# Patient Record
Sex: Female | Born: 1965
Health system: Southern US, Community
[De-identification: ages and names within clinical notes are randomized; demographics above are authoritative.]

## PROBLEM LIST (undated history)

## (undated) DIAGNOSIS — T7840XA Allergy, unspecified, initial encounter: Secondary | ICD-10-CM

## (undated) DIAGNOSIS — N289 Disorder of kidney and ureter, unspecified: Secondary | ICD-10-CM

## (undated) DIAGNOSIS — D649 Anemia, unspecified: Secondary | ICD-10-CM

## (undated) DIAGNOSIS — C801 Malignant (primary) neoplasm, unspecified: Secondary | ICD-10-CM

## (undated) DIAGNOSIS — I10 Essential (primary) hypertension: Secondary | ICD-10-CM

## (undated) DIAGNOSIS — K5790 Diverticulosis of intestine, part unspecified, without perforation or abscess without bleeding: Secondary | ICD-10-CM

## (undated) DIAGNOSIS — M549 Dorsalgia, unspecified: Secondary | ICD-10-CM

## (undated) DIAGNOSIS — R5383 Other fatigue: Secondary | ICD-10-CM

## (undated) DIAGNOSIS — F329 Major depressive disorder, single episode, unspecified: Secondary | ICD-10-CM

## (undated) DIAGNOSIS — E7800 Pure hypercholesterolemia, unspecified: Secondary | ICD-10-CM

## (undated) DIAGNOSIS — F32A Depression, unspecified: Secondary | ICD-10-CM

## (undated) HISTORY — PX: URETERAL REIMPLANTION: SHX2611

## (undated) HISTORY — DX: Allergy, unspecified, initial encounter: T78.40XA

## (undated) HISTORY — PX: OTHER SURGICAL HISTORY: SHX169

## (undated) HISTORY — PX: APPENDECTOMY: SHX54

## (undated) HISTORY — DX: Other fatigue: R53.83

## (undated) HISTORY — PX: ENDOMETRIAL ABLATION: SHX621

## (undated) HISTORY — DX: Diverticulosis of intestine, part unspecified, without perforation or abscess without bleeding: K57.90

## (undated) HISTORY — DX: Dorsalgia, unspecified: M54.9

## (undated) HISTORY — DX: Anemia, unspecified: D64.9

## (undated) HISTORY — DX: Malignant (primary) neoplasm, unspecified: C80.1

## (undated) HISTORY — DX: Depression, unspecified: F32.A

## (undated) HISTORY — DX: Pure hypercholesterolemia, unspecified: E78.00

---
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
## 2002-02-21 ENCOUNTER — Ambulatory Visit (HOSPITAL_COMMUNITY): Admission: RE | Admit: 2002-02-21 | Discharge: 2002-02-21 | Payer: Self-pay | Admitting: Urology

## 2002-02-21 ENCOUNTER — Encounter: Payer: Self-pay | Admitting: Urology

## 2011-04-03 DIAGNOSIS — L7211 Pilar cyst: Secondary | ICD-10-CM | POA: Insufficient documentation

## 2011-04-13 DIAGNOSIS — L723 Sebaceous cyst: Secondary | ICD-10-CM | POA: Insufficient documentation

## 2011-08-24 DIAGNOSIS — S61019A Laceration without foreign body of unspecified thumb without damage to nail, initial encounter: Secondary | ICD-10-CM | POA: Insufficient documentation

## 2012-03-22 DIAGNOSIS — M25579 Pain in unspecified ankle and joints of unspecified foot: Secondary | ICD-10-CM | POA: Insufficient documentation

## 2013-10-24 DIAGNOSIS — L249 Irritant contact dermatitis, unspecified cause: Secondary | ICD-10-CM | POA: Insufficient documentation

## 2013-10-24 DIAGNOSIS — B379 Candidiasis, unspecified: Secondary | ICD-10-CM | POA: Insufficient documentation

## 2014-04-16 DIAGNOSIS — N390 Urinary tract infection, site not specified: Secondary | ICD-10-CM | POA: Insufficient documentation

## 2014-04-20 DIAGNOSIS — Z905 Acquired absence of kidney: Secondary | ICD-10-CM | POA: Insufficient documentation

## 2014-08-16 LAB — HM COLONOSCOPY

## 2017-10-25 LAB — CBC AND DIFFERENTIAL
HCT: 41 (ref 36–46)
Hemoglobin: 13.7 (ref 12.0–16.0)
Neutrophils Absolute: 3
Platelets: 223 (ref 150–399)
WBC: 5.7

## 2017-10-25 LAB — CBC: RBC: 4.33 (ref 3.87–5.11)

## 2017-10-27 LAB — HM PAP SMEAR: HM Pap smear: NEGATIVE

## 2018-05-24 LAB — HM MAMMOGRAPHY

## 2018-07-22 MED FILL — LOSARTAN POTASSIUM 50 MG TA: 50 | 30 days supply | Qty: 30 | Fill #0

## 2018-07-22 MED FILL — LOSARTAN POTASSIUM 25 MG TA: 25 | 30 days supply | Qty: 30 | Fill #0

## 2018-08-26 MED FILL — LOSARTAN POTASSIUM 50 MG TA: 50 | 30 days supply | Qty: 30 | Fill #1

## 2018-08-26 MED FILL — LOSARTAN POTASSIUM 25 MG TA: 25 | 30 days supply | Qty: 30 | Fill #1

## 2018-10-13 MED FILL — LOSARTAN POTASSIUM 25 MG TA: 25 | 30 days supply | Qty: 30 | Fill #2

## 2018-10-13 MED FILL — LOSARTAN POTASSIUM 50 MG TA: 50 | 30 days supply | Qty: 30 | Fill #2

## 2018-11-21 MED FILL — LOSARTAN POTASSIUM 25 MG TA: 25 | 30 days supply | Qty: 30 | Fill #3

## 2018-11-21 MED FILL — LOSARTAN POTASSIUM 50 MG TA: 50 | 30 days supply | Qty: 30 | Fill #3

## 2019-01-06 MED FILL — LOSARTAN POTASSIUM 50 MG TA: 50 | 30 days supply | Qty: 30 | Fill #4

## 2019-01-06 MED FILL — LOSARTAN POTASSIUM 25 MG TA: 25 | 30 days supply | Qty: 30 | Fill #4

## 2019-02-07 ENCOUNTER — Emergency Department (INDEPENDENT_AMBULATORY_CARE_PROVIDER_SITE_OTHER)
Admission: EM | Admit: 2019-02-07 | Discharge: 2019-02-07 | Disposition: A | Discharge status: Home / Self Care | Payer: 59 | Source: Home / Self Care

## 2019-02-07 ENCOUNTER — Encounter: Payer: Self-pay | Admitting: Family Medicine

## 2019-02-07 ENCOUNTER — Other Ambulatory Visit: Payer: Self-pay

## 2019-02-07 DIAGNOSIS — K5732 Diverticulitis of large intestine without perforation or abscess without bleeding: Secondary | ICD-10-CM

## 2019-02-07 DIAGNOSIS — R300 Dysuria: Secondary | ICD-10-CM | POA: Diagnosis not present

## 2019-02-07 HISTORY — DX: Disorder of kidney and ureter, unspecified: N28.9

## 2019-02-07 HISTORY — DX: Essential (primary) hypertension: I10

## 2019-02-07 LAB — POCT URINALYSIS DIP (MANUAL ENTRY)
Bilirubin, UA: NEGATIVE
Glucose, UA: NEGATIVE mg/dL
Ketones, POC UA: NEGATIVE mg/dL
Nitrite, UA: NEGATIVE
Protein Ur, POC: NEGATIVE mg/dL
Spec Grav, UA: 1.015 (ref 1.010–1.025)
Urobilinogen, UA: 0.2 E.U./dL
pH, UA: 6 (ref 5.0–8.0)

## 2019-02-07 MED ORDER — AMOXICILLIN-POT CLAVULANATE 875-125 MG PO TABS: 1.0000 | ORAL_TABLET | Freq: Two times a day (BID) | ORAL | 0 refills | Status: DC

## 2019-02-07 MED FILL — AMOX-CLAV 875-125 MG TABLET: 875-125 | 7 days supply | Qty: 14 | Fill #0

## 2019-02-07 NOTE — ED Provider Notes (Signed)
[name]    CSN: [id number] Arrival date & time: 02/07/19  1315      History   Chief Complaint Chief Complaint  Patient presents with  . Dysuria    HPI Lisa Glass is a 53 y.o. female.   Initial visit for this 53 yo woman complaining of urinary symptoms.  Typically, she gets one UTI per year.  She has been having "bladder pressure" for about a week now.  No fever, flank pain.    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 Hypertension   . Renal disorder     There are no active problems to display for this patient.   Past Surgical History:  Procedure Laterality Date  . ENDOMETRIAL ABLATION    . kidney     removal    OB History   No obstetric history on file.      Home Medications    Prior to Admission medications   Medication Sig Start Date End Date Taking? Authorizing Provider  losartan (COZAAR) 100 MG tablet Take 75 mg by mouth daily.   Yes [provider]  amoxicillin-clavulanate (AUGMENTIN) 875-125 MG tablet Take 1 tablet by mouth every 12 (twelve) hours. 02/07/19   Robyn Haber, MD    Family History No family history on file.  Social History Social History   Tobacco Use  . Smoking status: Not on file  Substance Use Topics  . Alcohol use: Not on file  . Drug use: Not on file     Allergies   Patient has no known allergies.   Review of Systems Review of Systems  Constitutional: Negative.   HENT: Negative.   Gastrointestinal: Positive for constipation and nausea.  Genitourinary: Positive for pelvic pain. Negative for dysuria and frequency.  Neurological: Negative.   All other systems reviewed and are negative.    Physical Exam Triage Vital Signs ED Triage Vitals  Enc Vitals Group     BP      Pulse      Resp      Temp      Temp src       SpO2      Weight      Height      Head Circumference      Peak Flow      Pain Score      Pain Loc      Pain Edu?      Excl. in [place]?    No data found.  Updated Vital Signs BP (!) 161/84 (BP Location: Right Arm)   Pulse 74   Temp (!) 97.2 F (36.2 C) (Oral)   Resp 16   Ht 5\' 4"  (1.626 m)   Wt 90.7 kg   SpO2 99%   BMI 34.33 kg/m    Physical Exam Vitals signs and nursing note reviewed.  Constitutional:      Appearance: Normal appearance.  HENT:     Head: Normocephalic.  Eyes:     Conjunctiva/sclera: Conjunctivae normal.  Neck:     Musculoskeletal: Normal range of motion and neck supple.  Cardiovascular:     Rate and Rhythm: Normal rate.  Pulmonary:     Effort: Pulmonary effort is normal.     Breath sounds: Normal breath sounds.  Abdominal:     General: Abdomen is flat.  Tenderness: There is abdominal tenderness. There is no guarding or rebound.     Comments: Fullness and tenderness in LLQ  Musculoskeletal: Normal range of motion.  Skin:    General: Skin is warm and dry.  Neurological:     General: No focal deficit present.     Mental Status: She is alert and oriented to person, place, and time.  Psychiatric:        Mood and Affect: Mood normal.      UC Treatments / Results  Labs (all labs ordered are listed, but only abnormal results are displayed) Labs Reviewed  URINE CULTURE  POCT URINALYSIS DIP (MANUAL ENTRY)    EKG   Radiology No results found.  Procedures Procedures (including critical care time)  Medications Ordered in UC Medications - No data to display  Initial Impression / Assessment and Plan / UC Course  I have reviewed the triage vital signs and the nursing notes.  Pertinent labs & imaging results that were available during my care of the patient were reviewed by me and considered in my medical decision making (see chart for details).    Final Clinical Impressions(s) / UC Diagnoses   Final diagnoses:  Dysuria   Diverticulitis of colon   Discharge Instructions   None    ED Prescriptions    Medication Sig Dispense Auth. Provider   amoxicillin-clavulanate (AUGMENTIN) 875-125 MG tablet Take 1 tablet by mouth every 12 (twelve) hours. 14 tablet Robyn Haber, MD     I have reviewed the PDMP during this encounter.   Robyn Haber, MD 02/07/19 1413

## 2019-02-07 NOTE — ED Triage Notes (Signed)
Patient has history of kidney problems; gets UTIs at least once per year; about one week ago began experiencing bladder pressure with no other S&S.  She has not travelled outside [place].  She has had her influenza vacc this season.

## 2019-02-14 MED FILL — LOSARTAN POTASSIUM 25 MG TA: 25 | 30 days supply | Qty: 30 | Fill #5

## 2019-02-14 MED FILL — LOSARTAN POTASSIUM 50 MG TA: 50 | 30 days supply | Qty: 30 | Fill #5

## 2019-04-03 MED FILL — LOSARTAN POTASSIUM 25 MG TA: 25 | 30 days supply | Qty: 30 | Fill #0

## 2019-04-03 MED FILL — LOSARTAN POTASSIUM 50 MG TA: 50 | 30 days supply | Qty: 30 | Fill #0

## 2019-05-03 DIAGNOSIS — I10 Essential (primary) hypertension: Secondary | ICD-10-CM | POA: Diagnosis not present

## 2019-05-03 DIAGNOSIS — Q600 Renal agenesis, unilateral: Secondary | ICD-10-CM | POA: Diagnosis not present

## 2019-05-08 MED FILL — LOSARTAN POTASSIUM 50 MG TA: 50 | 30 days supply | Qty: 30 | Fill #1

## 2019-05-08 MED FILL — LOSARTAN POTASSIUM 25 MG TA: 25 | 30 days supply | Qty: 30 | Fill #1

## 2019-06-20 MED FILL — LOSARTAN POTASSIUM 50 MG TA: 50 | 30 days supply | Qty: 30 | Fill #2

## 2019-06-20 MED FILL — LOSARTAN POTASSIUM 25 MG TA: 25 | 30 days supply | Qty: 30 | Fill #2

## 2019-08-04 MED FILL — LOSARTAN POTASSIUM 25 MG TA: 25 | 90 days supply | Qty: 90 | Fill #0

## 2019-08-04 MED FILL — LOSARTAN POTASSIUM 50 MG TA: 50 | 90 days supply | Qty: 90 | Fill #0

## 2019-08-15 ENCOUNTER — Encounter: Payer: Self-pay | Admitting: Osteopathic Medicine

## 2019-08-15 ENCOUNTER — Other Ambulatory Visit: Payer: Self-pay

## 2019-08-15 ENCOUNTER — Ambulatory Visit (INDEPENDENT_AMBULATORY_CARE_PROVIDER_SITE_OTHER): Payer: 59 | Admitting: Osteopathic Medicine

## 2019-08-15 VITALS — BP 132/89 | HR 60 | Temp 97.7°F | Ht 63.0 in | Wt 194.0 lb

## 2019-08-15 DIAGNOSIS — Z0100 Encounter for examination of eyes and vision without abnormal findings: Secondary | ICD-10-CM

## 2019-08-15 DIAGNOSIS — Z1231 Encounter for screening mammogram for malignant neoplasm of breast: Secondary | ICD-10-CM

## 2019-08-15 DIAGNOSIS — K5792 Diverticulitis of intestine, part unspecified, without perforation or abscess without bleeding: Secondary | ICD-10-CM | POA: Diagnosis not present

## 2019-08-15 DIAGNOSIS — Z23 Encounter for immunization: Secondary | ICD-10-CM

## 2019-08-15 DIAGNOSIS — E28319 Asymptomatic premature menopause: Secondary | ICD-10-CM

## 2019-08-15 MED ORDER — METRONIDAZOLE 500 MG PO TABS: 500.0000 mg | ORAL_TABLET | Freq: Three times a day (TID) | ORAL | 0 refills | Status: AC

## 2019-08-15 MED ORDER — CIPROFLOXACIN HCL 500 MG PO TABS: 500.0000 mg | ORAL_TABLET | Freq: Two times a day (BID) | ORAL | 0 refills | Status: AC

## 2019-08-15 NOTE — Progress Notes (Signed)
Lisa Glass is a 54 y.o. female who presents to  [facility]  today, 08/15/19, seeking care for the following: . Establish care  . Abdominal pain, feel sismilar to previous diverticulitis flare   Pleasant new pt here to establish Works for [employer], is an Therapist, sports w/ PICU at [employer]   Hx HTN, depression, solitary kidney d/t s/p R nephrectomy Currently on Losartan 75 mg daily   Ongoing fatigue a few years   1 episode of palpitations at nighttime, heart pounding, resolved spontaneously, has happened similarly maybe 2-3 times total    Abdominal pain w/ hx diverticulitis 02/2019. Concerned about recurrence not, no fever. (+)LLQ colicky pain xfew days   Former smoker quit as teenager    1 functioning kidney s/p nephrectomy as child d/t reflux complications  Follows w/ nephrology last visit 05/03/19: cont meds, f/u annually  Labs at that time: Cr 0.84  No periods d/t s/p endometrial ablation  Pap 10/2017 NILM and (-)HPV  Early menopause     ASSESSMENT & PLAN with other pertinent history/findings:  The primary encounter diagnosis was Diverticulitis. Diagnoses of Need for shingles vaccine, Encounter for screening mammogram for malignant neoplasm of breast, Premature menopause, and Eye exam, routine were also pertinent to this visit.      Orders Placed This Encounter  Procedures  . DG DXA FRACTURE ASSESSMENT  . MM 3D SCREEN BREAST BILATERAL  . Varicella-zoster vaccine IM (Shingrix)  . Ambulatory referral to Optometry    Meds ordered this encounter  Medications  . ciprofloxacin (CIPRO) 500 MG tablet    Sig: Take 1 tablet (500 mg total) by mouth 2 (two) times daily for 7 days.    Dispense:  14 tablet    Refill:  0  . metroNIDAZOLE (FLAGYL) 500 MG tablet    Sig: Take 1 tablet (500 mg total) by mouth 3 (three) times daily for 7 days.    Dispense:  21 tablet    Refill:  0       Follow-up instructions: Return in about 6  months (around 02/15/2020) for ANNUAL (call week prior to visit for lab orders), SHINGLES SHOT #2 .                                         BP 132/89 (BP Location: Left Arm, Patient Position: Sitting, Cuff Size: Normal)   Pulse 60   Temp 97.7 F (36.5 C) (Oral)   Ht 5\' 3"  (1.6 m)   Wt 194 lb 0.6 oz (88 kg)   BMI 34.37 kg/m   Current Meds  Medication Sig  . losartan (COZAAR) 100 MG tablet Take 75 mg by mouth daily.    No results found for this or any previous visit (from the past 72 hour(s)).  No results found.  Depression screen PHQ 2/9 08/15/2019  Decreased Interest 1  Down, Depressed, Hopeless 1  PHQ - 2 Score 2  Altered sleeping 1  Tired, decreased energy 1  Change in appetite 0  Feeling bad or failure about yourself  0  Trouble concentrating 0  Moving slowly or fidgety/restless 0  Suicidal thoughts 0  PHQ-9 Score 4  Difficult doing work/chores Somewhat difficult    GAD 7 : Generalized Anxiety Score 08/15/2019  Nervous, Anxious, on Edge 0  Control/stop worrying 0  Worry too much - different things 0  Trouble  relaxing 0  Restless 0  Easily annoyed or irritable 0  Afraid - awful might happen 0  Total GAD 7 Score 0  Anxiety Difficulty Not difficult at all      All questions at time of visit were answered - patient instructed to contact office with any additional concerns or updates.  ER/RTC precautions were reviewed with the patient.  Please note: voice recognition software was used to produce this document, and typos may escape review. Please contact Dr. Sheppard Coil for any needed clarifications.

## 2019-08-15 NOTE — Progress Notes (Signed)
Negative for intraepithelial lesion or malignancy.  

## 2019-08-20 ENCOUNTER — Encounter: Payer: Self-pay | Admitting: Osteopathic Medicine

## 2019-08-21 ENCOUNTER — Encounter: Payer: Self-pay | Admitting: Osteopathic Medicine

## 2019-08-23 ENCOUNTER — Other Ambulatory Visit: Payer: Self-pay

## 2019-08-23 ENCOUNTER — Ambulatory Visit (INDEPENDENT_AMBULATORY_CARE_PROVIDER_SITE_OTHER): Payer: 59

## 2019-08-23 DIAGNOSIS — E28319 Asymptomatic premature menopause: Secondary | ICD-10-CM | POA: Diagnosis not present

## 2019-08-23 DIAGNOSIS — Z780 Asymptomatic menopausal state: Secondary | ICD-10-CM | POA: Diagnosis not present

## 2019-08-23 DIAGNOSIS — Z1231 Encounter for screening mammogram for malignant neoplasm of breast: Secondary | ICD-10-CM

## 2019-08-23 DIAGNOSIS — Z13820 Encounter for screening for osteoporosis: Secondary | ICD-10-CM | POA: Diagnosis not present

## 2019-08-24 MED ORDER — AMOXICILLIN-POT CLAVULANATE 875-125 MG PO TABS
1.0000 | ORAL_TABLET | Freq: Two times a day (BID) | ORAL | 0 refills | Status: DC
Start: 2019-08-24 — End: 2020-03-07

## 2019-08-25 MED FILL — AMOX-CLAV 875-125 MG TABLET: 875-125 | 5 days supply | Qty: 10 | Fill #0

## 2019-11-13 ENCOUNTER — Other Ambulatory Visit (HOSPITAL_COMMUNITY): Payer: Self-pay | Admitting: Internal Medicine

## 2019-11-13 MED FILL — LOSARTAN POTASSIUM 25 MG TA: 25 | 90 days supply | Qty: 90 | Fill #0

## 2019-11-17 ENCOUNTER — Other Ambulatory Visit (HOSPITAL_COMMUNITY): Payer: Self-pay | Admitting: Internal Medicine

## 2019-11-17 MED FILL — LOSARTAN POTASSIUM 50 MG TA: 50 | 90 days supply | Qty: 90 | Fill #0

## 2020-01-15 ENCOUNTER — Encounter: Payer: 59 | Admitting: Osteopathic Medicine

## 2020-01-29 ENCOUNTER — Encounter: Payer: 59 | Admitting: Osteopathic Medicine

## 2020-02-26 MED FILL — LOSARTAN POTASSIUM 25 MG TA: 25 | 90 days supply | Qty: 90 | Fill #1

## 2020-02-26 MED FILL — LOSARTAN POTASSIUM 50 MG TA: 50 | 90 days supply | Qty: 90 | Fill #1

## 2020-03-07 ENCOUNTER — Other Ambulatory Visit (HOSPITAL_COMMUNITY): Payer: Self-pay | Admitting: Osteopathic Medicine

## 2020-03-07 ENCOUNTER — Ambulatory Visit (INDEPENDENT_AMBULATORY_CARE_PROVIDER_SITE_OTHER): Payer: 59 | Admitting: Osteopathic Medicine

## 2020-03-07 ENCOUNTER — Encounter: Payer: Self-pay | Admitting: Osteopathic Medicine

## 2020-03-07 VITALS — BP 144/90 | HR 69 | Temp 97.7°F | Wt 198.2 lb

## 2020-03-07 DIAGNOSIS — Z905 Acquired absence of kidney: Secondary | ICD-10-CM

## 2020-03-07 DIAGNOSIS — F339 Major depressive disorder, recurrent, unspecified: Secondary | ICD-10-CM | POA: Diagnosis not present

## 2020-03-07 DIAGNOSIS — Z0000 Encounter for general adult medical examination without abnormal findings: Secondary | ICD-10-CM | POA: Diagnosis not present

## 2020-03-07 DIAGNOSIS — B001 Herpesviral vesicular dermatitis: Secondary | ICD-10-CM

## 2020-03-07 DIAGNOSIS — I10 Essential (primary) hypertension: Secondary | ICD-10-CM | POA: Diagnosis not present

## 2020-03-07 MED ORDER — VALACYCLOVIR HCL 1 G PO TABS: 2000.0000 mg | ORAL_TABLET | Freq: Two times a day (BID) | ORAL | 0 refills | Status: AC

## 2020-03-07 MED ORDER — BUPROPION HCL ER (XL) 150 MG PO TB24
150.0000 mg | ORAL_TABLET | ORAL | 1 refills | Status: DC
Start: 2020-03-07 — End: 2020-05-23

## 2020-03-07 MED FILL — valACYclovir HCL 1 GM TABS: 1 | 3 days supply | Qty: 12 | Fill #0

## 2020-03-07 MED FILL — buPROPion HCL ER (XL) 150 M: 150 | 90 days supply | Qty: 90 | Fill #0

## 2020-03-07 NOTE — Progress Notes (Signed)
HPI: Lisa Glass is a 54 y.o. female who  has a past medical history of Depression, Hypertension, and Renal disorder.  she presents to [facility] today, 03/07/20,  for chief complaint of: Annual physical   Mouth ulcerations/cracking and flaking of lips . Context: reports hx of cold sores starting in mid 93s . Location: around lips and in corners of mouth . Quality: dry, flaking skin, with occasional vesicles . Duration: 2 years consistent; about 6 years intermittent . Modifying factors: o Hydrocortisone cream and lotrimin did not help o Unsure if worsened by stress given significant stress in general over last few years . Assoc signs/symptoms: burning sensation, itching   Mental health Pt reports depressed mood for the past year with worsening over the past few months after her father was admitted to the hospital where he lives in [place]. She reports sleeping 16+ hours a day on her days off, anhedonia, and 30 lb weight gain over last year.  Pt has history of depression previously treated with Wellbutrin 300 mg with good response most recently about 4 years ago.   Hypertension Blood pressure somewhat elevated and patient reports increase of 30 lb weight gain over last year and decreased physical activity which may be contributing.    Past medical, surgical, social and family history reviewed:  Patient Active Problem List   Diagnosis Date Noted  . Premature menopause 08/15/2019  . Eye exam, routine 08/15/2019  . Diverticulitis 08/15/2019    Past Surgical History:  Procedure Laterality Date  . ENDOMETRIAL ABLATION    . kidney     removal  . URETERAL REIMPLANTION Bilateral     Social History   Tobacco Use  . Smoking status: Former Smoker    Types: Cigarettes  . Smokeless tobacco: Never Used  Substance Use Topics  . Alcohol use: Yes    Alcohol/week: 7.0 - 8.0 standard drinks    Types: 7 - 8 Glasses of wine per week    Family  History  Problem Relation Age of Onset  . High blood pressure Mother   . High blood pressure Father   . High blood pressure Sister        Twin  . Ovarian cancer Maternal Aunt   . Heart attack Maternal Uncle      Current medication list and allergy/intolerance information reviewed:    Current Outpatient Medications  Medication Sig Dispense Refill  . losartan (COZAAR) 100 MG tablet Take 75 mg by mouth daily.    Marland Kitchen buPROPion (WELLBUTRIN XL) 150 MG 24 hr tablet Take 1 tablet (150 mg total) by mouth every morning. 90 tablet 1  . valACYclovir (VALTREX) 1000 MG tablet Take 2 tablets (2,000 mg total) by mouth 2 (two) times daily for 1 day. For one day as needed for cold sore 12 tablet 0   No current facility-administered medications for this visit.    No Known Allergies    Review of Systems:  Constitutional:  No  fever, no chills, No unintentional weight changes.   HEENT: No  headache  Gastrointestinal:  + abdominal discomfort in LLQ, No  nausea, No  vomiting,  No  blood in stool, No  diarrhea, No  constipation   Skin: +  Rash around lips and in corners of mouth, No other wounds/concerning lesions  Endocrine: No cold intolerance,  No heat intolerance.  Psychiatric: +  concerns with depression, No  concerns with anxiety, + sleep problems  Exam:  BP (!) 144/90 (BP  Location: Left Arm)   Pulse 69   Temp 97.7 F (36.5 C) (Oral)   Wt 198 lb 3.2 oz (89.9 kg)   BMI 35.11 kg/m   Constitutional: VS see above. General Appearance: alert, well-developed, well-nourished, NAD  Ears, Nose, Mouth, Throat: MMM, Normal external inspection mouth/lips/gums.   Neck: No masses, trachea midline. No thyroid enlargement. No tenderness/mass appreciated. No lymphadenopathy  Respiratory: Normal respiratory effort. no wheeze, no rhonchi, no rales  Cardiovascular: S1/S2 normal, no murmur, no rub/gallop auscultated. RRR. No lower extremity edema.   Gastrointestinal: + tenderness to deep palpation in  LLQ, no masses. No hepatomegaly, no splenomegaly. No hernia appreciated.Rectal exam deferred.   Musculoskeletal: Gait normal.   Neurological: Normal balance/coordination. No tremor.   Skin: warm, dry. Partially healed ulceration present on L side of upper lip. Dry peeling skin and erythema present on skin surrounding lips. No vesicles at this time.   Psychiatric: Normal judgment/insight. Normal mood and affect.    No results found for this or any previous visit (from the past 72 hour(s)).  No results found.     ASSESSMENT/PLAN: The primary encounter diagnosis was Annual physical exam. Diagnoses of Depression, recurrent ([place]), Herpes labialis, Essential hypertension, and Single kidney were also pertinent to this visit.   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  Round of valtrex   Pt photos c/w angular cheilitis but some mild blistering appearance, very dry cracked flaking character to the vermilion borders   Will consider higher potency topical steroid if not responsive to valtrex, +/- derm referral    Hypertension   Will recheck labs listed below  Will start additional bp medication pending labs - likely beta blocker given single kidney status   counseled on diet and lifestyle   Return for recheck in one month    Single kidney   UA  Microalbumin/creatinine urine ratio   Health maintenance   CBC, CMP, lipid panel   Orders Placed This Encounter  Procedures  . CBC  . COMPLETE METABOLIC PANEL WITH GFR  . Lipid panel  . Urinalysis, Routine w reflex microscopic  . Microalbumin / creatinine urine ratio    Meds ordered this encounter  Medications  . buPROPion (WELLBUTRIN XL) 150 MG 24 hr tablet    Sig: Take 1 tablet (150 mg total) by mouth every morning.    Dispense:  90 tablet    Refill:  1  . valACYclovir (VALTREX) 1000 MG tablet    Sig: Take 2 tablets (2,000 mg total) by mouth 2 (two)  times daily for 1 day. For one day as needed for cold sore    Dispense:  12 tablet    Refill:  0    Patient Instructions  Mental health: restart Wellbutrin at 150 mg, option to increase if this is not helping within a month or so.  Mouth ulcerations: possibly oral HSV, will send Rx for antivirals and if this is helping then we can either use this as-needed or can use continuously for prevention (assuming kidney function is ok) Blood pressure: not terrible but not great! Will recheck labs right now, and will probably start additional BP medication. Work on weight loss. Maintain low salt diet.   General Preventive Care  Most recent routine screening labs: ordered today   Blood pressure goal 140/90 or less.   Tobacco: don't!   Alcohol: responsible moderation is ok for most adults - if you have concerns about  your alcohol intake, please talk to me!   Exercise: as tolerated to reduce risk of cardiovascular disease and diabetes. Strength training will also prevent osteoporosis.   Mental health: if need for mental health care (medicines, counseling, other), or concerns about moods, please let me know!   Sexual / Reproductive health: if need for STD testing, or if concerns with libido/pain problems, please let me know!  Advanced Directive: Living Will and/or Healthcare Power of Attorney recommended for all adults, regardless of age or health.  Vaccines  Flu vaccine: for almost everyone, every fall.   Shingles vaccine: done!  Tetanus booster: due 2022  COVID vaccine: THANKS for getting your vaccine! :)  Cancer screenings   Colon cancer screening: due 2026  Breast cancer screening: due 2022  Cervical cancer screening: due 2024 Infection screenings  . HIV: recommended screening at least once age 72-65, more often as needed. . Gonorrhea/Chlamydia: screening as needed . Hepatitis C: recommended once for everyone age 38-75 Other . Bone Density Test: done 08/2019 - repeat in 2 years         Visit summary with medication list and pertinent instructions was printed for patient to review. All questions at time of visit were answered - patient instructed to contact office with any additional concerns or updates. ER/RTC precautions were reviewed with the patient.     Please note: voice recognition software was used to produce this document, and typos may escape review. Please contact Dr. Sheppard Coil for any needed clarifications.     Follow-up plan: Return in about 4 weeks (around 04/04/2020) for IN-OFFICE VISIT MONITOR BLOOD PRESSURE, MENTAL HEALTH. Marland Kitchen

## 2020-03-07 NOTE — Patient Instructions (Addendum)
Mental health: restart Wellbutrin at 150 mg, option to increase if this is not helping within a month or so.  Mouth ulcerations: possibly oral HSV, will send Rx for antivirals and if this is helping then we can either use this as-needed or can use continuously for prevention (assuming kidney function is ok) Blood pressure: not terrible but not great! Will recheck labs right now, and will probably start additional BP medication. Work on weight loss. Maintain low salt diet.   General Preventive Care  Most recent routine screening labs: ordered today   Blood pressure goal 140/90 or less.   Tobacco: don't!   Alcohol: responsible moderation is ok for most adults - if you have concerns about your alcohol intake, please talk to me!   Exercise: as tolerated to reduce risk of cardiovascular disease and diabetes. Strength training will also prevent osteoporosis.   Mental health: if need for mental health care (medicines, counseling, other), or concerns about moods, please let me know!   Sexual / Reproductive health: if need for STD testing, or if concerns with libido/pain problems, please let me know!  Advanced Directive: Living Will and/or Healthcare Power of Attorney recommended for all adults, regardless of age or health.  Vaccines  Flu vaccine: for almost everyone, every fall.   Shingles vaccine: done!  Tetanus booster: due 2022  COVID vaccine: THANKS for getting your vaccine! :)  Cancer screenings   Colon cancer screening: due 2026  Breast cancer screening: due 2022  Cervical cancer screening: due 2024 Infection screenings  . HIV: recommended screening at least once age 46-65, more often as needed. . Gonorrhea/Chlamydia: screening as needed . Hepatitis C: recommended once for everyone age 82-75 Other . Bone Density Test: done 08/2019 - repeat in 2 years

## 2020-03-08 LAB — URINALYSIS, ROUTINE W REFLEX MICROSCOPIC
Bacteria, UA: NONE SEEN /HPF
Bilirubin Urine: NEGATIVE
Glucose, UA: NEGATIVE
Hyaline Cast: NONE SEEN /LPF
Ketones, ur: NEGATIVE
Leukocytes,Ua: NEGATIVE
Nitrite: NEGATIVE
Protein, ur: NEGATIVE
RBC / HPF: NONE SEEN /HPF (ref 0–2)
Specific Gravity, Urine: 1.007 (ref 1.001–1.03)
Squamous Epithelial / HPF: NONE SEEN /HPF (ref <=5)
WBC, UA: NONE SEEN /HPF (ref 0–5)
pH: 6 (ref 5.0–8.0)

## 2020-03-08 LAB — COMPLETE METABOLIC PANEL WITH GFR
AG Ratio: 1.7 (calc) (ref 1.0–2.5)
ALT: 12 U/L (ref 6–29)
AST: 23 U/L (ref 10–35)
Albumin: 4.8 g/dL (ref 3.6–5.1)
Alkaline phosphatase (APISO): 84 U/L (ref 37–153)
BUN: 17 mg/dL (ref 7–25)
CO2: 24 mmol/L (ref 20–32)
Calcium: 9.6 mg/dL (ref 8.6–10.4)
Chloride: 103 mmol/L (ref 98–110)
Creat: 0.93 mg/dL (ref 0.50–1.05)
GFR, Est African American: 82 mL/min/{1.73_m2} (ref >=60)
GFR, Est Non African American: 71 mL/min/{1.73_m2} (ref >=60)
Globulin: 2.8 g/dL (calc) (ref 1.9–3.7)
Glucose, Bld: 86 mg/dL (ref 65–99)
Potassium: 3.9 mmol/L (ref 3.5–5.3)
Sodium: 139 mmol/L (ref 135–146)
Total Bilirubin: 0.4 mg/dL (ref 0.2–1.2)
Total Protein: 7.6 g/dL (ref 6.1–8.1)

## 2020-03-08 LAB — LIPID PANEL
Cholesterol: 238 mg/dL — ABNORMAL HIGH (ref <200)
HDL: 75 mg/dL (ref >=50)
LDL Cholesterol (Calc): 141 mg/dL (calc) — ABNORMAL HIGH
Non-HDL Cholesterol (Calc): 163 mg/dL (calc) — ABNORMAL HIGH (ref <130)
Total CHOL/HDL Ratio: 3.2 (calc) (ref <5.0)
Triglycerides: 108 mg/dL (ref <150)

## 2020-03-08 LAB — CBC
HCT: 41.7 % (ref 35.0–45.0)
Hemoglobin: 14 g/dL (ref 11.7–15.5)
MCH: 31.5 pg (ref 27.0–33.0)
MCHC: 33.6 g/dL (ref 32.0–36.0)
MCV: 93.7 fL (ref 80.0–100.0)
MPV: 12.1 fL (ref 7.5–12.5)
Platelets: 229 10*3/uL (ref 140–400)
RBC: 4.45 10*6/uL (ref 3.80–5.10)
RDW: 13.1 % (ref 11.0–15.0)
WBC: 7.1 10*3/uL (ref 3.8–10.8)

## 2020-03-08 LAB — MICROALBUMIN / CREATININE URINE RATIO
Creatinine, Urine: 34 mg/dL (ref 20–275)
Microalb Creat Ratio: 126 mcg/mg creat — ABNORMAL HIGH (ref <30)
Microalb, Ur: 4.3 mg/dL

## 2020-03-09 ENCOUNTER — Encounter: Payer: Self-pay | Admitting: Osteopathic Medicine

## 2020-03-21 ENCOUNTER — Other Ambulatory Visit (HOSPITAL_COMMUNITY): Payer: Self-pay | Admitting: Osteopathic Medicine

## 2020-03-21 ENCOUNTER — Ambulatory Visit (INDEPENDENT_AMBULATORY_CARE_PROVIDER_SITE_OTHER): Payer: 59 | Admitting: Osteopathic Medicine

## 2020-03-21 ENCOUNTER — Encounter: Payer: Self-pay | Admitting: Osteopathic Medicine

## 2020-03-21 ENCOUNTER — Other Ambulatory Visit: Payer: Self-pay

## 2020-03-21 VITALS — BP 116/87 | HR 69 | Temp 97.9°F | Wt 193.1 lb

## 2020-03-21 DIAGNOSIS — R109 Unspecified abdominal pain: Secondary | ICD-10-CM | POA: Diagnosis not present

## 2020-03-21 DIAGNOSIS — R1032 Left lower quadrant pain: Secondary | ICD-10-CM

## 2020-03-21 DIAGNOSIS — R809 Proteinuria, unspecified: Secondary | ICD-10-CM

## 2020-03-21 DIAGNOSIS — Z8719 Personal history of other diseases of the digestive system: Secondary | ICD-10-CM

## 2020-03-21 DIAGNOSIS — R112 Nausea with vomiting, unspecified: Secondary | ICD-10-CM

## 2020-03-21 DIAGNOSIS — E28319 Asymptomatic premature menopause: Secondary | ICD-10-CM | POA: Diagnosis not present

## 2020-03-21 DIAGNOSIS — L989 Disorder of the skin and subcutaneous tissue, unspecified: Secondary | ICD-10-CM | POA: Diagnosis not present

## 2020-03-21 DIAGNOSIS — Z8742 Personal history of other diseases of the female genital tract: Secondary | ICD-10-CM

## 2020-03-21 MED ORDER — TRIAMCINOLONE ACETONIDE 0.5 % EX CREA: 1.0000 "application " | TOPICAL_CREAM | Freq: Two times a day (BID) | CUTANEOUS | 1 refills | Status: DC

## 2020-03-21 MED FILL — TRIAMCINOLONE ACETONIDE 0.5: 0.5 | 15 days supply | Qty: 30 | Fill #0

## 2020-03-21 NOTE — Patient Instructions (Addendum)
Plan:  CT scan of abdomen Will recheck urine for blood and protein Will recheck labs for kidney function given protein in urine  Await CT results If worse, especially if fever - to hospital!   Referral to dermatology for lip issue  I sent steroid cream to use in the meantime

## 2020-03-21 NOTE — Progress Notes (Signed)
HPI: Lisa Glass is a 54 y.o. female who  has a past medical history of Depression, Hypertension, and Renal disorder.  she presents to [facility] today, 03/21/20,  for chief complaint of:  L flank pain . Context: pt has hx single L kidney  . Location: L flank pain radiating from LLQ initially . Quality: dull ache  . Severity: had to leave work due to pain yesterday, but has improved since and is now tolerable . Duration: 1 day  . Modifying factors: none . Assoc signs/symptoms: nausea x1 day, constipation x2 days (not relieved by miralax)  o No issues voiding, frequency, urgency, fever, diarrhea, early satiety, vomiting, hematuria, no vaginal bleeding    LLQ pain . Context: history of ovarian cysts; post menopausal; post endometrial ablation; no fmhx of ovarian cancer; hx diverticulitis . Location: LLQ  . Quality: colicky . Severity: moderate . Duration: over a year, worse past few days  . Modifying factors: none . Assoc signs/symptoms: as above     Past medical, surgical, social and family history reviewed:  Patient Active Problem List   Diagnosis Date Noted  . Premature menopause 08/15/2019  . Eye exam, routine 08/15/2019  . Diverticulitis 08/15/2019    Past Surgical History:  Procedure Laterality Date  . ENDOMETRIAL ABLATION    . kidney     removal  . URETERAL REIMPLANTION Bilateral     Social History   Tobacco Use  . Smoking status: Former Smoker    Types: Cigarettes  . Smokeless tobacco: Never Used  Substance Use Topics  . Alcohol use: Yes    Alcohol/week: 7.0 - 8.0 standard drinks    Types: 7 - 8 Glasses of wine per week    Family History  Problem Relation Age of Onset  . High blood pressure Mother   . High blood pressure Father   . High blood pressure Sister        Twin  . Ovarian cancer Maternal Aunt   . Heart attack Maternal Uncle      Current medication list and allergy/intolerance information reviewed:     Current Outpatient Medications  Medication Sig Dispense Refill  . buPROPion (WELLBUTRIN XL) 150 MG 24 hr tablet Take 1 tablet (150 mg total) by mouth every morning. 90 tablet 1  . losartan (COZAAR) 100 MG tablet Take 75 mg by mouth daily.    Marland Kitchen triamcinolone cream (KENALOG) 0.5 % Apply 1 application topically 2 (two) times daily. To affected areas. 30 g 1   No current facility-administered medications for this visit.    No Known Allergies    Review of Systems:  Constitutional:  No  fever, no chills, No recent illness, No unintentional weight changes.   Gastrointestinal: + abdominal pain, + nausea, No  vomiting,  No  blood in stool, No  diarrhea, +  Constipation, no early satiety   Skin: +  Rash around lips (dry and flaking unchanged from prior visit)   Genitourinary:  No  abnormal genital bleeding, No urgency, No dysuria, No frequency, No urgency, No difficulty voiding  Exam:  BP 116/87 (BP Location: Left Arm, Patient Position: Sitting, Cuff Size: Large)   Pulse 69   Temp 97.9 F (36.6 C) (Oral)   Wt 193 lb 1.9 oz (87.6 kg)   BMI 34.21 kg/m   Constitutional: VS see above. General Appearance: alert, well-developed, well-nourished, NAD  Cardiovascular: S1/S2 normal, no murmur, no rub/gallop auscultated. RRR.  Gastrointestinal: + moderate tenderness to palpation in  LLQ and mild tenderness with ballot of L kidney, no masses. No hepatomegaly, no splenomegaly. No hernia appreciated. Bowel sounds normal. Rectal exam deferred.   Neurological: Normal balance/coordination.   Skin: warm, dry, intact. dry flaking erythematous skin around lips with no lesions noted  Psychiatric: Normal judgment/insight. Normal mood and affect.     No results found for this or any previous visit (from the past 72 hour(s)).  No results found.   ASSESSMENT/PLAN: The primary encounter diagnosis was Left lower quadrant pain. Diagnoses of Left flank pain, History of ovarian cyst, History of  diverticulitis, Proteinuria, unspecified type, Skin abnormality, Premature menopause, Left lower quadrant abdominal pain, and Non-intractable vomiting with nausea, unspecified vomiting type were also pertinent to this visit.   L flank and LLQ pain - likely nephrolithiasis, but given vague symptoms not classic for nephrolithiasis want to rule out ovarian v GI origin though less likely given postmenopausal and minimal GI symptoms.   CT abdomen and pelvis w/ and w/o contrast    ED precautions given   Follow up pending results or earlier if pain worsens or changes   Proteinuria  Rechecking labs UA, Microalbumin/creatinine ratio, CBC w/ diff, CMP, TSH   Has routine f/u w/ nephrology next month    Rash - did not respond to valtrex or lotrimin   Derm referral   Rx for triamcinolone to use in the mean time   Orders Placed This Encounter  Procedures  . CT ABDOMEN PELVIS W WO CONTRAST  . Urine Microscopic  . Microalbumin / creatinine urine ratio  . Urinalysis  . CBC with Differential/Platelet  . COMPLETE METABOLIC PANEL WITH GFR  . TSH  . Ambulatory referral to Dermatology    Meds ordered this encounter  Medications  . triamcinolone cream (KENALOG) 0.5 %    Sig: Apply 1 application topically 2 (two) times daily. To affected areas.    Dispense:  30 g    Refill:  1    Patient Instructions  Plan:  CT scan of abdomen Will recheck urine for blood and protein Will recheck labs for kidney function given protein in urine  Await CT results If worse, especially if fever - to hospital!   Referral to dermatology for lip issue  I sent steroid cream to use in the meantime          Visit summary with medication list and pertinent instructions was printed for patient to review. All questions at time of visit were answered - patient instructed to contact office with any additional concerns or updates. ER/RTC precautions were reviewed with the patient.    Please note: voice  recognition software was used to produce this document, and typos may escape review. Please contact Dr. Sheppard Coil for any needed clarifications.     Follow-up plan: Return for RECHECK PENDING RESULTS / IF WORSE OR CHANGE.

## 2020-03-22 LAB — CBC WITH DIFFERENTIAL/PLATELET
Absolute Monocytes: 369 cells/uL (ref 200–950)
Basophils Absolute: 28 cells/uL (ref 0–200)
Basophils Relative: 0.5 %
Eosinophils Absolute: 77 cells/uL (ref 15–500)
Eosinophils Relative: 1.4 %
HCT: 40.5 % (ref 35.0–45.0)
Hemoglobin: 14 g/dL (ref 11.7–15.5)
Lymphs Abs: 1958 cells/uL (ref 850–3900)
MCH: 32.6 pg (ref 27.0–33.0)
MCHC: 34.6 g/dL (ref 32.0–36.0)
MCV: 94.4 fL (ref 80.0–100.0)
MPV: 12.5 fL (ref 7.5–12.5)
Monocytes Relative: 6.7 %
Neutro Abs: 3069 cells/uL (ref 1500–7800)
Neutrophils Relative %: 55.8 %
Platelets: 221 10*3/uL (ref 140–400)
RBC: 4.29 10*6/uL (ref 3.80–5.10)
RDW: 12.9 % (ref 11.0–15.0)
Total Lymphocyte: 35.6 %
WBC: 5.5 10*3/uL (ref 3.8–10.8)

## 2020-03-22 LAB — MICROALBUMIN / CREATININE URINE RATIO
Creatinine, Urine: 90 mg/dL (ref 20–275)
Microalb Creat Ratio: 69 mcg/mg creat — ABNORMAL HIGH (ref <30)
Microalb, Ur: 6.2 mg/dL

## 2020-03-22 LAB — COMPLETE METABOLIC PANEL WITH GFR
AG Ratio: 1.9 (calc) (ref 1.0–2.5)
ALT: 12 U/L (ref 6–29)
AST: 22 U/L (ref 10–35)
Albumin: 4.4 g/dL (ref 3.6–5.1)
Alkaline phosphatase (APISO): 73 U/L (ref 37–153)
BUN: 19 mg/dL (ref 7–25)
CO2: 27 mmol/L (ref 20–32)
Calcium: 9.5 mg/dL (ref 8.6–10.4)
Chloride: 103 mmol/L (ref 98–110)
Creat: 0.91 mg/dL (ref 0.50–1.05)
GFR, Est African American: 84 mL/min/{1.73_m2} (ref >=60)
GFR, Est Non African American: 73 mL/min/{1.73_m2} (ref >=60)
Globulin: 2.3 g/dL (calc) (ref 1.9–3.7)
Glucose, Bld: 97 mg/dL (ref 65–99)
Potassium: 4.4 mmol/L (ref 3.5–5.3)
Sodium: 137 mmol/L (ref 135–146)
Total Bilirubin: 0.7 mg/dL (ref 0.2–1.2)
Total Protein: 6.7 g/dL (ref 6.1–8.1)

## 2020-03-22 LAB — URINALYSIS
Bilirubin Urine: NEGATIVE
Glucose, UA: NEGATIVE
Hgb urine dipstick: NEGATIVE
Ketones, ur: NEGATIVE
Leukocytes,Ua: NEGATIVE
Nitrite: NEGATIVE
Protein, ur: NEGATIVE
Specific Gravity, Urine: 1.019 (ref 1.001–1.03)
pH: 7 (ref 5.0–8.0)

## 2020-03-22 LAB — URINALYSIS, MICROSCOPIC ONLY
Bacteria, UA: NONE SEEN /HPF
Hyaline Cast: NONE SEEN /LPF
Squamous Epithelial / HPF: NONE SEEN /HPF (ref <=5)

## 2020-03-22 LAB — TSH: TSH: 1.06 mIU/L

## 2020-03-22 NOTE — Progress Notes (Signed)
MyChart message sent:  Labs all good except still protein in urine though not as high as few weeks ago. No blood in urine. Awaiting CT results!

## 2020-03-27 ENCOUNTER — Other Ambulatory Visit: Payer: Self-pay

## 2020-03-27 ENCOUNTER — Ambulatory Visit (INDEPENDENT_AMBULATORY_CARE_PROVIDER_SITE_OTHER): Payer: 59

## 2020-03-27 ENCOUNTER — Other Ambulatory Visit: Payer: 59

## 2020-03-27 ENCOUNTER — Encounter: Payer: Self-pay | Admitting: Osteopathic Medicine

## 2020-03-27 DIAGNOSIS — K429 Umbilical hernia without obstruction or gangrene: Secondary | ICD-10-CM | POA: Diagnosis not present

## 2020-03-27 DIAGNOSIS — R319 Hematuria, unspecified: Secondary | ICD-10-CM

## 2020-03-27 DIAGNOSIS — Z905 Acquired absence of kidney: Secondary | ICD-10-CM | POA: Diagnosis not present

## 2020-03-27 DIAGNOSIS — R1032 Left lower quadrant pain: Secondary | ICD-10-CM

## 2020-03-27 DIAGNOSIS — Z1211 Encounter for screening for malignant neoplasm of colon: Secondary | ICD-10-CM

## 2020-03-27 DIAGNOSIS — Z8719 Personal history of other diseases of the digestive system: Secondary | ICD-10-CM

## 2020-03-27 MED ORDER — IOHEXOL 300 MG/ML  SOLN
100.0000 mL | Freq: Once | INTRAMUSCULAR | Status: AC | PRN
  Administered 2020-03-27: 14:00:00 100 mL via INTRAVENOUS

## 2020-03-28 ENCOUNTER — Ambulatory Visit (INDEPENDENT_AMBULATORY_CARE_PROVIDER_SITE_OTHER): Payer: 59 | Admitting: Osteopathic Medicine

## 2020-03-28 ENCOUNTER — Encounter: Payer: Self-pay | Admitting: Osteopathic Medicine

## 2020-03-28 ENCOUNTER — Other Ambulatory Visit (HOSPITAL_BASED_OUTPATIENT_CLINIC_OR_DEPARTMENT_OTHER): Payer: Self-pay | Admitting: Osteopathic Medicine

## 2020-03-28 VITALS — BP 161/102 | HR 90 | Wt 191.0 lb

## 2020-03-28 DIAGNOSIS — R1903 Right lower quadrant abdominal swelling, mass and lump: Secondary | ICD-10-CM

## 2020-03-28 MED ORDER — ONDANSETRON 8 MG PO TBDP: 8.0000 mg | ORAL_TABLET | Freq: Three times a day (TID) | ORAL | 1 refills | Status: DC | PRN

## 2020-03-28 MED ORDER — DIAZEPAM 5 MG PO TABS: 2.5000 mg | ORAL_TABLET | Freq: Three times a day (TID) | ORAL | 0 refills | Status: DC | PRN

## 2020-03-28 MED ORDER — TRAMADOL HCL 50 MG PO TABS: 50.0000 mg | ORAL_TABLET | Freq: Three times a day (TID) | ORAL | 0 refills | Status: DC | PRN

## 2020-03-28 MED FILL — traMADol HCL 50 MG TABS: 50 | 4 days supply | Qty: 20 | Fill #0

## 2020-03-28 MED FILL — ONDANSETRON ODT 8 MG TABLET: 8 | 6 days supply | Qty: 20 | Fill #0

## 2020-03-28 MED FILL — diazePAM 5 MG TABS: 5 | 10 days supply | Qty: 30 | Fill #0

## 2020-03-28 NOTE — Patient Instructions (Addendum)
Refer gen surg [facility] Surgery   Pain Rx OK to take Miralax  OK to take Valium - try 1/2 tablet at first Also sent nausea Rx   Disc of images from [facility]: please call to request  [phone] Ask them to mail you a disc with images from CT scans: 01/152016 04/30/2014 12/29/2015

## 2020-03-28 NOTE — Progress Notes (Signed)
Lisa Glass is a 54 y.o. female who presents to  [place] at [facility]  today, 03/28/20, seeking care for the following:  . Continued lower abdominal pain, significant anxiety around CT findings. She is still having intermittent cramping pain in RLQ and occasional in LLQ. Alternative diarrhea and constipation. On pelvic exam, no adnexal or uterine mass noted. Abdominal exam, BS WNL, no rebound/guarding, some tenderness lower RLQ      ASSESSMENT & PLAN with other pertinent findings:  The encounter diagnosis was Right lower quadrant abdominal mass.   Reviewed CT images w/ patient compared to reports from previous imaging, the mass sounds like is in the same place and is slightly larger than in 2017. I doubt malignancy would grow this slowly and still be isolated to one lesion but certainly enlargement may result in being able to cause discomfort/pain. Removal or at the very least a biopsy is needed for definitive diagnosis - referral to surgery   Patient Instructions  Refer gen surg [facility] Surgery   Pain Rx OK to take Miralax  OK to take Valium - try 1/2 tablet at first Also sent nausea Rx   Disc of images from [facility]: please call to request  [phone] Ask them to mail you a disc with images from CT scans: 01/152016 04/30/2014 12/29/2015   Orders Placed This Encounter  Procedures  . Ambulatory referral to General Surgery    Meds ordered this encounter  Medications  . traMADol (ULTRAM) 50 MG tablet    Sig: Take 1 tablet (50 mg total) by mouth every 8 (eight) hours as needed for up to 5 days for moderate pain. 1-2 tabs by mouth Q8 hours, maximum 6 tabs per day.    Dispense:  20 tablet    Refill:  0  . diazepam (VALIUM) 5 MG tablet    Sig: Take 0.5-1 tablets (2.5-5 mg total) by mouth every 8 (eight) hours as needed for anxiety (vertigo/dizziness).    Dispense:  30 tablet    Refill:  0  . ondansetron (ZOFRAN-ODT) 8  MG disintegrating tablet    Sig: Take 1 tablet (8 mg total) by mouth every 8 (eight) hours as needed for nausea.    Dispense:  20 tablet    Refill:  1       Follow-up instructions: Return for RECHECK PENDING RESULTS / IF WORSE OR CHANGE.                                         BP (!) 161/102   Pulse 90   Wt 191 lb (86.6 kg)   SpO2 98%   BMI 33.83 kg/m   Current Meds  Medication Sig  . buPROPion (WELLBUTRIN XL) 150 MG 24 hr tablet Take 1 tablet (150 mg total) by mouth every morning.  Marland Kitchen losartan (COZAAR) 100 MG tablet Take 75 mg by mouth daily.  Marland Kitchen triamcinolone cream (KENALOG) 0.5 % Apply 1 application topically 2 (two) times daily. To affected areas.    No results found for this or any previous visit (from the past 72 hour(s)).  CT ABDOMEN PELVIS W CONTRAST  Result Date: 03/27/2020 CLINICAL DATA:  Flank pain and hematuria. Left lower quadrant pain renal stone or diverticulitis suspected. Nephrectomy. Endometrial ablation. EXAM: CT ABDOMEN AND PELVIS WITH CONTRAST TECHNIQUE: Multidetector CT imaging of the abdomen and pelvis was performed using  the standard protocol following bolus administration of intravenous contrast. CONTRAST:  15mL OMNIPAQUE IOHEXOL 300 MG/ML  SOLN COMPARISON:  Nuclear medicine renal 02/21/2002 FINDINGS: Lower chest: No acute abnormality. Hepatobiliary: No focal liver abnormality. No gallstones, gallbladder wall thickening, or pericholecystic fluid. No biliary dilatation. Pancreas: No focal lesion. Normal pancreatic contour. No surrounding inflammatory changes. No main pancreatic ductal dilatation. Spleen: Normal in size without focal abnormality. Adrenals/Urinary Tract: No adrenal nodule bilaterally. Status post right nephrectomy. The left kidney enhances homogeneous Lea. No hydronephrosis. No hydroureter. No nephroureterolithiasis. On delayed imaging, there is no urothelial wall thickening and there are no filling defects in  the opacified portions of the left collecting system or ureter. The urinary bladder is unremarkable. Stomach/Bowel: PO contrast reaches the distal sigmoid colon. Stomach is within normal limits. No evidence of bowel wall thickening or dilatation. Few scattered sigmoid diverticula are noted. Appendix appears normal. Vascular/Lymphatic: No abdominal aorta or iliac aneurysm. No abdominal, pelvic, or inguinal lymphadenopathy. Reproductive: Uterus and bilateral adnexa are unremarkable. Other: There is a right lower abdomen/retroperitoneal 2.5 x 3.3 cm soft tissue density adjacent to the inferior vena cava bifurcation and located just superior to the tip of the appendix (2:45). No intraperitoneal free fluid. No intraperitoneal free gas. No organized fluid collection. Musculoskeletal: Tiny fat containing umbilical hernia. No suspicious lytic or blastic osseous lesions. No acute displaced fracture. Multilevel trace degenerative changes of the spine. IMPRESSION: Right lower abdomen/retroperitoneal 2.5 x 3.3 cm soft tissue density adjacent to the inferior vena cava bifurcation and located just superior to the tip of the appendix. Findings concerning for malignancy in a patient with history of right nephrectomy. Comparison with prior cross-sectional imaging would be of value. Electronically Signed   By: Iven Finn M.D.   On: 03/27/2020 16:27       All questions at time of visit were answered - patient instructed to contact office with any additional concerns or updates.  ER/RTC precautions were reviewed with the patient as applicable.   Please note: voice recognition software was used to produce this document, and typos may escape review. Please contact Dr. Sheppard Coil for any needed clarifications.

## 2020-03-30 ENCOUNTER — Emergency Department (HOSPITAL_COMMUNITY)
Admission: EM | Admit: 2020-03-30 | Discharge: 2020-03-30 | Disposition: A | Discharge status: Home / Self Care | Payer: 59 | Attending: Emergency Medicine | Admitting: Emergency Medicine

## 2020-03-30 ENCOUNTER — Other Ambulatory Visit (HOSPITAL_COMMUNITY): Payer: Self-pay | Admitting: Emergency Medicine

## 2020-03-30 ENCOUNTER — Encounter (HOSPITAL_COMMUNITY): Payer: Self-pay

## 2020-03-30 ENCOUNTER — Other Ambulatory Visit: Payer: Self-pay

## 2020-03-30 DIAGNOSIS — Z87891 Personal history of nicotine dependence: Secondary | ICD-10-CM | POA: Diagnosis not present

## 2020-03-30 DIAGNOSIS — I10 Essential (primary) hypertension: Secondary | ICD-10-CM | POA: Insufficient documentation

## 2020-03-30 DIAGNOSIS — R1031 Right lower quadrant pain: Secondary | ICD-10-CM | POA: Diagnosis not present

## 2020-03-30 DIAGNOSIS — Z79899 Other long term (current) drug therapy: Secondary | ICD-10-CM | POA: Diagnosis not present

## 2020-03-30 DIAGNOSIS — R197 Diarrhea, unspecified: Secondary | ICD-10-CM | POA: Insufficient documentation

## 2020-03-30 DIAGNOSIS — R110 Nausea: Secondary | ICD-10-CM | POA: Diagnosis not present

## 2020-03-30 LAB — URINALYSIS, ROUTINE W REFLEX MICROSCOPIC
Bacteria, UA: NONE SEEN
Bilirubin Urine: NEGATIVE
Glucose, UA: NEGATIVE mg/dL
Hgb urine dipstick: NEGATIVE
Ketones, ur: NEGATIVE mg/dL
Nitrite: NEGATIVE
Protein, ur: 30 mg/dL — AB
Specific Gravity, Urine: 1.021 (ref 1.005–1.030)
pH: 7 (ref 5.0–8.0)

## 2020-03-30 LAB — COMPREHENSIVE METABOLIC PANEL
ALT: 15 U/L (ref 0–44)
AST: 23 U/L (ref 15–41)
Albumin: 4.6 g/dL (ref 3.5–5.0)
Alkaline Phosphatase: 72 U/L (ref 38–126)
Anion gap: 11 (ref 5–15)
BUN: 12 mg/dL (ref 6–20)
CO2: 25 mmol/L (ref 22–32)
Calcium: 9.6 mg/dL (ref 8.9–10.3)
Chloride: 103 mmol/L (ref 98–111)
Creatinine, Ser: 1.14 mg/dL — ABNORMAL HIGH (ref 0.44–1.00)
GFR, Estimated: 58 mL/min — ABNORMAL LOW (ref >60)
Glucose, Bld: 98 mg/dL (ref 70–99)
Potassium: 4 mmol/L (ref 3.5–5.1)
Sodium: 139 mmol/L (ref 135–145)
Total Bilirubin: 0.5 mg/dL (ref 0.3–1.2)
Total Protein: 7.4 g/dL (ref 6.5–8.1)

## 2020-03-30 LAB — I-STAT BETA HCG BLOOD, ED (MC, WL, AP ONLY): I-stat hCG, quantitative: <5 m[IU]/mL (ref <5)

## 2020-03-30 LAB — CBC
HCT: 43 % (ref 36.0–46.0)
Hemoglobin: 14.1 g/dL (ref 12.0–15.0)
MCH: 31.7 pg (ref 26.0–34.0)
MCHC: 32.8 g/dL (ref 30.0–36.0)
MCV: 96.6 fL (ref 80.0–100.0)
Platelets: 228 10*3/uL (ref 150–400)
RBC: 4.45 MIL/uL (ref 3.87–5.11)
RDW: 12.6 % (ref 11.5–15.5)
WBC: 6.8 10*3/uL (ref 4.0–10.5)
nRBC: 0 % (ref 0.0–0.2)

## 2020-03-30 LAB — LIPASE, BLOOD: Lipase: 30 U/L (ref 11–51)

## 2020-03-30 MED ORDER — LACTATED RINGERS IV BOLUS
1000.0000 mL | Freq: Once | INTRAVENOUS | Status: AC
  Administered 2020-03-30: 16:00:00 1000 mL via INTRAVENOUS

## 2020-03-30 MED ORDER — HYDROMORPHONE HCL 1 MG/ML IJ SOLN
0.5000 mg | Freq: Once | INTRAMUSCULAR | Status: AC
  Administered 2020-03-30: 16:00:00 0.5 mg via INTRAVENOUS
  Filled 2020-03-30: qty 1

## 2020-03-30 MED ORDER — CEPHALEXIN 500 MG PO CAPS: 500.0000 mg | ORAL_CAPSULE | Freq: Two times a day (BID) | ORAL | 0 refills | Status: AC

## 2020-03-30 MED ORDER — CEPHALEXIN 500 MG PO CAPS: 500.0000 mg | ORAL_CAPSULE | Freq: Two times a day (BID) | ORAL | 0 refills | Status: DC

## 2020-03-30 MED ORDER — HYDROCODONE-ACETAMINOPHEN 5-325 MG PO TABS: 1.0000 | ORAL_TABLET | Freq: Four times a day (QID) | ORAL | 0 refills | Status: AC | PRN

## 2020-03-30 NOTE — ED Notes (Signed)
Patient verbalizes understanding of discharge instructions. Prescriptions reviewed. Opportunity for questioning and answers were provided. Armband removed by staff, pt discharged from ED ambulatory.

## 2020-03-30 NOTE — ED Triage Notes (Signed)
Pt reports right sided abd pain for the past week, hx of a mesenteric mass. Pt had CT scan done earlier this week that showed that it has grown bigger, her PCP has an order for gen surgery consult but pt cannot wait til next week due to the pain. Pt tearful in triage. Taking tramadol without relief. Denies n/v, no blood in stools.

## 2020-03-30 NOTE — Discharge Instructions (Addendum)
Thank you for choosing [facility] for your care today.  To do: Stop taking the tramadol. You can try a new pain control regimen as follows: tylenol scheduled 325mg  every 6 hours, Norco every 6hrs as needed for moderate or severe pain. Follow up with your PCP as planned; you may want to call her next week to follow up on possibly having an updated colonoscopy.  Follow up with surgery as planned; if you have not heard from them early next week, touch base with your PCP. Please follow up with your primary care doctor in the next few days. If you do not have a PCP you are established with, you can call [phone]  or [phone]  to access [facility] service. You can also visit [email or web address] Please come back to the Emergency Department if you have shortness of breath, chest pain, confusion/mental status changes, if you have so much nausea/vomiting that you cannot keep down fluids, or if you have any other symptoms that worry you.  Take care. Hope you start feeling better soon.  Vanna Scotland, MD Emergency Medicine

## 2020-03-30 NOTE — ED Provider Notes (Signed)
[place] EMERGENCY DEPARTMENT Provider Note   CSN: [id number] Arrival date & time: 03/30/20  1420     History No chief complaint on file.   Lisa Glass is a 54 y.o. female.  HPI Pt is a 54 y/o female with a history of HTN, depression, s/p R nephrectomy (at age 65 y/o) who presents to ED with complaints of abdominal pain, nausea, diarrhea. Patient states she has been having worsening right lower quadrant pain for about the last week. 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 Radiates to her suprapubic area and to her lower back. Notably, pt has a retroperitoneal soft tissue density noted on recent imaging just superior to appendiceal tip noted on CT 03/27/20. Patient states this mass was noted on prior imaging in 2016 or 2017, but is now slightly larger compared to prior. Patient is very anxious regarding the cause of her symptoms given imaging findings with increase in size. She currently has a referral for general surgery from her primary care doctor but has not been in to see them yet. She has been taking tramadol, but the pain relief does not last in between doses and has become intermittently intolerable.    Past Medical History:  Diagnosis Date  . Depression   . Hypertension   . Renal disorder     Patient Active Problem List   Diagnosis Date Noted  . Premature menopause 08/15/2019  . Eye exam, routine 08/15/2019  . Diverticulitis 08/15/2019    Past Surgical History:  Procedure Laterality Date  . ENDOMETRIAL ABLATION    . kidney     removal  . URETERAL REIMPLANTION Bilateral      OB History   No obstetric history on file.     Family History  Problem Relation Age of Onset  . High blood pressure Mother   . High blood pressure Father   . High blood pressure Sister        Twin  . Ovarian cancer Maternal Aunt   . Heart attack Maternal Uncle     Social History   Tobacco  Use  . Smoking status: Former Smoker    Types: Cigarettes  . Smokeless tobacco: Never Used  Vaping Use  . Vaping Use: Never used  Substance Use Topics  . Alcohol use: Yes    Alcohol/week: 7.0 - 8.0 standard drinks    Types: 7 - 8 Glasses of wine per week  . Drug use: Never    Home Medications Prior to Admission medications   Medication Sig Start Date End Date Taking? Authorizing Provider  buPROPion (WELLBUTRIN XL) 150 MG 24 hr tablet Take 1 tablet (150 mg total) by mouth every morning. 03/07/20   Emeterio Reeve, DO  cephALEXin (KEFLEX) 500 MG capsule Take 1 capsule (500 mg total) by mouth 2 (two) times daily for 7 days. 03/30/20 0000000  Vanna Scotland, MD  diazepam (VALIUM) 5 MG tablet Take 0.5-1 tablets (2.5-5 mg total) by mouth every 8 (eight) hours as needed for anxiety (vertigo/dizziness). 03/28/20   Emeterio Reeve, DO  HYDROcodone-acetaminophen (NORCO/VICODIN) 5-325 MG tablet Take 1 tablet by mouth every 6 (six) hours as needed for up to 3 days (1 for moderate pain, 2 for severe pain). 03/30/20 [id number]  Vanna Scotland, MD  losartan (COZAAR) 100 MG tablet Take 75 mg by mouth daily.    [provider]  ondansetron (ZOFRAN-ODT) 8 MG disintegrating tablet Take 1  tablet (8 mg total) by mouth every 8 (eight) hours as needed for nausea. 03/28/20   Sunnie NielsenAlexander, Natalie, DO  triamcinolone cream (KENALOG) 0.5 % Apply 1 application topically 2 (two) times daily. To affected areas. 03/21/20   Sunnie NielsenAlexander, Natalie, DO    Allergies    Patient has no known allergies.  Review of Systems   Review of Systems  Constitutional: Negative for chills and fever.  HENT: Negative for rhinorrhea and sore throat.   Respiratory: Negative for cough, shortness of breath and wheezing.   Cardiovascular: Negative for chest pain and leg swelling.  Gastrointestinal: Positive for abdominal pain, diarrhea and nausea. Negative for blood in stool, constipation and vomiting.       Change in stool caliber   Genitourinary: Positive for pelvic pain. Negative for dysuria, hematuria, vaginal bleeding and vaginal discharge.  Musculoskeletal: Negative for back pain.  Skin: Negative for color change and rash.  Neurological: Negative for seizures and syncope.  Psychiatric/Behavioral: The patient is nervous/anxious.   All other systems reviewed and are negative.   Physical Exam Updated Vital Signs BP 135/86 (BP Location: Right Arm)   Pulse 73   Temp 98.7 F (37.1 C) (Oral)   Resp 16   SpO2 97%   Physical Exam Vitals and nursing note reviewed.  Constitutional:      General: She is not in acute distress.    Appearance: Normal appearance. She is well-developed and well-nourished. She is not ill-appearing or toxic-appearing.  HENT:     Head: Normocephalic and atraumatic.     Nose: Nose normal.     Mouth/Throat:     Mouth: Mucous membranes are moist.     Pharynx: Oropharynx is clear.  Eyes:     General: No scleral icterus.    Extraocular Movements: Extraocular movements intact.     Pupils: Pupils are equal, round, and reactive to light.  Cardiovascular:     Rate and Rhythm: Normal rate and regular rhythm.     Pulses: Normal pulses.     Heart sounds: No friction rub. No gallop.   Pulmonary:     Effort: Pulmonary effort is normal. No respiratory distress.     Breath sounds: Normal breath sounds.  Abdominal:     General: There is no distension.     Palpations: Abdomen is soft.     Tenderness: There is abdominal tenderness (Mild over RLQ and R aspect of mons).  Musculoskeletal:        General: No edema.     Cervical back: Neck supple.     Right lower leg: No edema.     Left lower leg: No edema.  Skin:    General: Skin is warm and dry.  Neurological:     Mental Status: She is alert.     Comments: Alert, grossly oriented, moves all extremities spontaneously  Psychiatric:        Mood and Affect: Mood and affect normal.        Behavior: Behavior normal.     Comments: Tearful, anxious      ED Results / Procedures / Treatments   Labs (all labs ordered are listed, but only abnormal results are displayed) Labs Reviewed  COMPREHENSIVE METABOLIC PANEL - Abnormal; Notable for the following components:      Result Value   Creatinine, Ser 1.14 (*)    GFR, Estimated 58 (*)    All other components within normal limits  URINALYSIS, ROUTINE W REFLEX MICROSCOPIC - Abnormal; Notable for the following components:  APPearance HAZY (*)    Protein, ur 30 (*)    Leukocytes,Ua MODERATE (*)    All other components within normal limits  URINE CULTURE  LIPASE, BLOOD  CBC  I-STAT BETA HCG BLOOD, ED ([facility], [facility], [facility] ONLY)    EKG None  Radiology No results found.  Procedures Procedures (including critical care time)  Medications Ordered in ED Medications  lactated ringers bolus 1,000 mL (0 mLs Intravenous Stopped 03/30/20 1733)  HYDROmorphone (DILAUDID) injection 0.5 mg (0.5 mg Intravenous Given 03/30/20 1618)    ED Course  I have reviewed the triage vital signs and the nursing notes.  Pertinent labs & imaging results that were available during my care of the patient were reviewed by me and considered in my medical decision making (see chart for details).    MDM Rules/Calculators/A&P                          54 y/o female with abdominal pain, nausea, diarrhea with known R retroperitoneal mass.  Patient given 1 L LR bolus and 0.5 mg IV Dilaudid upon arrival.  Differentials considered: I am most concerned for pain secondary to inflammation and enlargement of RLQ mass, but differential includes UTI, colonic mass (given change in stool caliber), diverticulitis, pancreatitis. Do not suspect bowel obstruction, appendicitis. ED provider interpretation of lab results: Lipase WNL, UA with leukocytes, unremarkable CBC, creatinine mildly elevated but stable compared to baseline (1.14).  MDM: On reassessment following fluids and pain control, patient says she is feeling much better.   She does note some suprapubic pressure and while she is not having dysuria, will plan to treat for possible UTI given acute worsening of pain over the last week.  Discussed patient's pain control regimen at home.  She says she has not been taking anything in addition to the tramadol.  We discussed that she could add on scheduled Tylenol and utilize a narcotic for breakthrough pain; will prescribe a few days' supply of Norco and have encouraged her to discontinue the tramadol.  Given patient's very recent imaging without evidence of appendicitis, hydronephrosis, uterine or adnexal abnormalities, gallbladder or liver abnormalities, and no evidence of diverticulitis (although patient does have diverticula), do not feel repeat imaging today is indicated.  She is hemodynamically stable and without evidence of sepsis or peritoneal signs.  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  Discussed that she will need to have biopsy of the mass for confirmation of diagnosis and recommend that she will follow up with her PCP on Monday (in 2 days). Also discussed she may want to have update colonoscopy performed given changes in stool caliber and her changes in pain; last colonoscopy was 2016 and pt reports was normal.  At this time, pt appears safe for discharge with strict return precautions provided and instructions for follow up given. Pt and family voiced understanding and are amenable to this plan. Pt discharged in stable condition.  Final Clinical Impression(s) / ED Diagnoses Final diagnoses:  Right lower quadrant abdominal pain    Rx / DC Orders ED Discharge Orders         Ordered    HYDROcodone-acetaminophen (NORCO/VICODIN) 5-325 MG tablet  Every 6 hours PRN        03/30/20 2018    cephALEXin (  KEFLEX) 500 MG  capsule  2 times daily,   Status:  Discontinued        03/30/20 2021    cephALEXin (KEFLEX) 500 MG capsule  2 times daily        03/30/20 2029           Vanna Scotland, MD 74/73/40 [id number]    Blanchie Dessert, MD 03/31/20 [phone]

## 2020-03-31 ENCOUNTER — Telehealth: Payer: Self-pay

## 2020-03-31 LAB — URINE CULTURE

## 2020-03-31 NOTE — Telephone Encounter (Signed)
Patient called in as the pharmacy that her medications went to was closed. She does have a paper script for her antibiotic, however the narcotic was called into a pharmacy that is closed, and it cannot be called to another pharmacy. The patient understands and will pick this up tomorrow.

## 2020-04-01 MED FILL — HYDROCODON-APAP 5-325: 5-325 | 2 days supply | Qty: 12 | Fill #0

## 2020-04-02 ENCOUNTER — Telehealth: Payer: Self-pay

## 2020-04-02 DIAGNOSIS — R1900 Intra-abdominal and pelvic swelling, mass and lump, unspecified site: Secondary | ICD-10-CM

## 2020-04-02 NOTE — Telephone Encounter (Signed)
Pt called with concerns regarding her upcoming surgical appt. Per pt, [facility] Pt does not want to wait since referral request is urgent. Pt is willing to go out of network for a sooner appointment. Referral coordinator has been updated of the following details.   Pt wants provider to be aware that FMLA form from [employer] will be faxed to the clinic. She is also requesting to return back to work on 04/05/20 and needs a work note from provider. Pls send letter via MyChart.

## 2020-04-03 ENCOUNTER — Encounter: Payer: Self-pay | Admitting: Osteopathic Medicine

## 2020-04-03 DIAGNOSIS — R1900 Intra-abdominal and pelvic swelling, mass and lump, unspecified site: Secondary | ICD-10-CM | POA: Insufficient documentation

## 2020-04-03 NOTE — Telephone Encounter (Signed)
Pt called requesting an update on her referral for general surgery. Pt is requesting coordinator to contact her with an update. Thanks.

## 2020-04-03 NOTE — Telephone Encounter (Signed)
I sent referral to [facility] to see if they could schedule patient sooner. I called patient and gave her the name and number to the office so she could call them if they did not reach out to her in the next couple of days. - CF

## 2020-04-04 DIAGNOSIS — R1903 Right lower quadrant abdominal swelling, mass and lump: Secondary | ICD-10-CM | POA: Diagnosis not present

## 2020-04-10 DIAGNOSIS — R1903 Right lower quadrant abdominal swelling, mass and lump: Secondary | ICD-10-CM | POA: Diagnosis not present

## 2020-04-11 ENCOUNTER — Encounter: Payer: Self-pay | Admitting: Nurse Practitioner

## 2020-04-11 ENCOUNTER — Telehealth: Payer: Self-pay | Admitting: Osteopathic Medicine

## 2020-04-11 NOTE — Telephone Encounter (Signed)
Pt called on 01/06 at 1030 to see if FMLA paperwork had been faxed. I explained you are out office this week and said I would try to find out. Did not see any documentation on the FMLA in her chart

## 2020-04-15 ENCOUNTER — Ambulatory Visit: Payer: 59 | Admitting: Osteopathic Medicine

## 2020-04-15 NOTE — Telephone Encounter (Addendum)
Task completed. FMLA form was faxed on 04/15/20 at 1038 am. Confirmation rec'd. Pt has been updated via [place]. Copy of FMLA form placed in scan folder.

## 2020-04-16 ENCOUNTER — Other Ambulatory Visit (HOSPITAL_COMMUNITY): Payer: Self-pay | Admitting: General Surgery

## 2020-04-16 DIAGNOSIS — C7A029 Malignant carcinoid tumor of the large intestine, unspecified portion: Secondary | ICD-10-CM | POA: Diagnosis not present

## 2020-04-16 DIAGNOSIS — E785 Hyperlipidemia, unspecified: Secondary | ICD-10-CM | POA: Diagnosis not present

## 2020-04-16 DIAGNOSIS — F32A Depression, unspecified: Secondary | ICD-10-CM | POA: Diagnosis not present

## 2020-04-16 DIAGNOSIS — Z87440 Personal history of urinary (tract) infections: Secondary | ICD-10-CM | POA: Diagnosis not present

## 2020-04-16 DIAGNOSIS — Z79899 Other long term (current) drug therapy: Secondary | ICD-10-CM | POA: Diagnosis not present

## 2020-04-16 DIAGNOSIS — Z905 Acquired absence of kidney: Secondary | ICD-10-CM | POA: Diagnosis not present

## 2020-04-16 DIAGNOSIS — D3A8 Other benign neuroendocrine tumors: Secondary | ICD-10-CM | POA: Diagnosis not present

## 2020-04-16 DIAGNOSIS — I10 Essential (primary) hypertension: Secondary | ICD-10-CM | POA: Diagnosis not present

## 2020-04-16 DIAGNOSIS — R1903 Right lower quadrant abdominal swelling, mass and lump: Secondary | ICD-10-CM | POA: Diagnosis not present

## 2020-04-16 DIAGNOSIS — C7A8 Other malignant neuroendocrine tumors: Secondary | ICD-10-CM | POA: Diagnosis not present

## 2020-04-16 DIAGNOSIS — Z8249 Family history of ischemic heart disease and other diseases of the circulatory system: Secondary | ICD-10-CM | POA: Diagnosis not present

## 2020-04-16 MED FILL — HYDROCODON-APAP 5-325: 5-325 | 5 days supply | Qty: 15 | Fill #0

## 2020-04-16 MED FILL — ONDANSETRON HCL 8 MG TABLET: 8 | 5 days supply | Qty: 15 | Fill #0

## 2020-04-23 DIAGNOSIS — R1903 Right lower quadrant abdominal swelling, mass and lump: Secondary | ICD-10-CM | POA: Diagnosis not present

## 2020-04-23 DIAGNOSIS — Z79899 Other long term (current) drug therapy: Secondary | ICD-10-CM | POA: Diagnosis not present

## 2020-04-23 DIAGNOSIS — F419 Anxiety disorder, unspecified: Secondary | ICD-10-CM | POA: Diagnosis not present

## 2020-04-23 DIAGNOSIS — Z87440 Personal history of urinary (tract) infections: Secondary | ICD-10-CM | POA: Diagnosis not present

## 2020-04-23 DIAGNOSIS — N1370 Vesicoureteral-reflux, unspecified: Secondary | ICD-10-CM | POA: Diagnosis not present

## 2020-04-23 DIAGNOSIS — C7A8 Other malignant neuroendocrine tumors: Secondary | ICD-10-CM | POA: Diagnosis not present

## 2020-04-23 DIAGNOSIS — F32A Depression, unspecified: Secondary | ICD-10-CM | POA: Diagnosis not present

## 2020-04-23 DIAGNOSIS — Z905 Acquired absence of kidney: Secondary | ICD-10-CM | POA: Diagnosis not present

## 2020-04-26 ENCOUNTER — Encounter: Payer: Self-pay | Admitting: Osteopathic Medicine

## 2020-05-08 DIAGNOSIS — C7A8 Other malignant neuroendocrine tumors: Secondary | ICD-10-CM | POA: Diagnosis not present

## 2020-05-08 DIAGNOSIS — R590 Localized enlarged lymph nodes: Secondary | ICD-10-CM | POA: Diagnosis not present

## 2020-05-09 ENCOUNTER — Encounter: Payer: Self-pay | Admitting: Physician Assistant

## 2020-05-14 DIAGNOSIS — F419 Anxiety disorder, unspecified: Secondary | ICD-10-CM | POA: Diagnosis not present

## 2020-05-14 DIAGNOSIS — K219 Gastro-esophageal reflux disease without esophagitis: Secondary | ICD-10-CM | POA: Diagnosis not present

## 2020-05-14 DIAGNOSIS — R7401 Elevation of levels of liver transaminase levels: Secondary | ICD-10-CM | POA: Diagnosis not present

## 2020-05-14 DIAGNOSIS — C7A8 Other malignant neuroendocrine tumors: Secondary | ICD-10-CM | POA: Diagnosis not present

## 2020-05-14 DIAGNOSIS — I10 Essential (primary) hypertension: Secondary | ICD-10-CM | POA: Diagnosis not present

## 2020-05-14 DIAGNOSIS — F329 Major depressive disorder, single episode, unspecified: Secondary | ICD-10-CM | POA: Diagnosis not present

## 2020-05-14 DIAGNOSIS — Z79899 Other long term (current) drug therapy: Secondary | ICD-10-CM | POA: Diagnosis not present

## 2020-05-14 DIAGNOSIS — E785 Hyperlipidemia, unspecified: Secondary | ICD-10-CM | POA: Diagnosis not present

## 2020-05-20 MED FILL — LOSARTAN POTASSIUM 50 MG TA: 50 | 90 days supply | Qty: 90 | Fill #2

## 2020-05-20 MED FILL — LOSARTAN POTASSIUM 25 MG TA: 25 | 30 days supply | Qty: 30 | Fill #2

## 2020-05-23 ENCOUNTER — Encounter: Payer: Self-pay | Admitting: Physician Assistant

## 2020-05-23 ENCOUNTER — Ambulatory Visit (INDEPENDENT_AMBULATORY_CARE_PROVIDER_SITE_OTHER): Payer: 59 | Admitting: Physician Assistant

## 2020-05-23 ENCOUNTER — Other Ambulatory Visit: Payer: Self-pay

## 2020-05-23 ENCOUNTER — Telehealth: Payer: Self-pay | Admitting: Physician Assistant

## 2020-05-23 ENCOUNTER — Other Ambulatory Visit (HOSPITAL_COMMUNITY): Payer: Self-pay | Admitting: Physician Assistant

## 2020-05-23 VITALS — BP 130/88 | HR 76 | Ht 65.0 in | Wt 181.6 lb

## 2020-05-23 DIAGNOSIS — R1032 Left lower quadrant pain: Secondary | ICD-10-CM | POA: Diagnosis not present

## 2020-05-23 DIAGNOSIS — C7A8 Other malignant neuroendocrine tumors: Secondary | ICD-10-CM

## 2020-05-23 MED ORDER — TRAMADOL HCL 50 MG PO TABS: 50.0000 mg | ORAL_TABLET | Freq: Four times a day (QID) | ORAL | 0 refills | Status: DC | PRN

## 2020-05-23 MED ORDER — NA SULFATE-K SULFATE-MG SULF 17.5-3.13-1.6 GM/177ML PO SOLN: 1.0000 | Freq: Once | ORAL | 0 refills | Status: AC

## 2020-05-23 MED ORDER — AMOXICILLIN-POT CLAVULANATE 875-125 MG PO TABS: 1.0000 | ORAL_TABLET | Freq: Two times a day (BID) | ORAL | 0 refills | Status: DC

## 2020-05-23 MED FILL — AMOX TR-K CLV 875-125 MG TA: 875-125 | 10 days supply | Qty: 20 | Fill #0

## 2020-05-23 MED FILL — SUPREP BOWEL PREP KIT: 17.5-3.13-1 | 1 days supply | Qty: 354 | Fill #0

## 2020-05-23 MED FILL — traMADol HCL 50 MG TABS: 50 | 10 days supply | Qty: 40 | Fill #0

## 2020-05-23 NOTE — Patient Instructions (Signed)
If you are age 53 or older, your body mass index should be between 23-30. Your Body mass index is 30.22 kg/m. If this is out of the aforementioned range listed, please consider follow up with your Primary Care Provider.  If you are age 31 or younger, your body mass index should be between 19-25. Your Body mass index is 30.22 kg/m. If this is out of the aformentioned range listed, please consider follow up with your Primary Care Provider.   You have been scheduled for a CT scan of the abdomen and pelvis at [facility] ([address]---this is in the same building as [employer]).   You are scheduled on 05/24/2020 at 10:00 am. You should arrive 15 minutes prior to your appointment time for registration. Please follow the written instructions below on the day of your exam:  WARNING: IF YOU ARE ALLERGIC TO IODINE/X-RAY DYE, PLEASE NOTIFY RADIOLOGY IMMEDIATELY AT [phone]! YOU WILL BE GIVEN A 13 HOUR PREMEDICATION PREP.  1) Do not eat or drink anything after 6:00 am (4 hours prior to your test) 2) You have been given 2 bottles of oral contrast to drink. The solution may taste better if refrigerated, but do NOT add ice or any other liquid to this solution. Shake well before drinking.    Drink 1 bottle of contrast @ 8:00 am (2 hours prior to your exam)  Drink 1 bottle of contrast @ 9:00 am (1 hour prior to your exam)  You may take any medications as prescribed with a small amount of water, if necessary. If you take any of the following medications: METFORMIN, GLUCOPHAGE, GLUCOVANCE, AVANDAMET, RIOMET, FORTAMET, [place] MET, JANUMET, GLUMETZA or METAGLIP, you MAY be asked to HOLD this medication 48 hours AFTER the exam.  The purpose of you drinking the oral contrast is to aid in the visualization of your intestinal tract. The contrast solution may cause some diarrhea. Depending on your individual set of symptoms, you may also receive an intravenous injection of x-ray  contrast/dye. Plan on being at [facility] for 30 minutes or longer, depending on the type of exam you are having performed.  This test typically takes 30-45 minutes to complete.  If you have any questions regarding your exam or if you need to reschedule, you may call the CT department at [phone] between the hours of 8:00 am and 5:00 pm, Monday-Friday.  ________________________________________________________________________  Dennis Bast have been scheduled for an endoscopy and colonoscopy. Please follow the written instructions given to you at your visit today. Please pick up your prep supplies at the pharmacy within the next 1-3 days. If you use inhalers (even only as needed), please bring them with you on the day of your procedure.  START Augmentin 1 tablet twice daily for 10 days START Tramadol 50 mg 1 tablet every 6 hours as needed.  Follow up pending at this time or as needed.  Thank you for entrusting me with your care and choosing [facility].  Amy Esterwood, PA-C

## 2020-05-23 NOTE — Progress Notes (Signed)
Assessment and plan reviewed. Based on the pathology and location, might suspect carcinoid. Okay for colonoscopy and upper endoscopy, though doubt metastatic neuroendocrine tumor from these locations. Thanks

## 2020-05-23 NOTE — Telephone Encounter (Signed)
Patient advised that we were going to be trying to send the script through Epic

## 2020-05-23 NOTE — Addendum Note (Signed)
Addended by: Alfredia Ferguson on: 05/23/2020 03:48 PM   Modules accepted: Orders

## 2020-05-23 NOTE — Progress Notes (Signed)
Subjective:    Patient ID: IVON ROEDEL, female    DOB: 1965-12-20, 55 y.o.   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 Patient had CT of the abdomen pelvis in December 2021 which showed a 2.5 x 3.3 cm retroperitoneal soft tissue density mass adjacent to the inferior vena cava bifurcation and just superior to the tip of the appendix.  A few diverticuli were noted in the sigmoid colon, patient is status post right nephrectomy. Patient underwent laparoscopic appendectomy and excision of this mass on 04/16/2020 through [facility], and path returned consistent with a well differentiated neuroendocrine malignancy. She had PET scan 05/09/2019 which showed 2 areas of hypermetabolic activity in the right lung and scattered nonspecific areas of activity over the bowel and a single inguinal lymph node. Patient reports having a normal screening colonoscopy done in 2016 at [facility]. 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  Chromogranin A 31.3 within normal limits.  24-hour urine for 5-HIAA 2.9  She says she is doing okay postoperatively but over the past 1 week has developed pain deep in the left lower abdomen and suprapubic area which has become constant to the point that she was unable to sleep last night.  No associated nausea or vomiting, no diarrhea melena or hematochezia, no fever or chills and denies any dysuria or other urinary symptoms. Patient says she has had a similar type pain in the past though not as severe and had been treated at one point in 2020 for diverticulitis and again in 2021.  She says this pain is more severe constant and dull.  Review of Systems Pertinent positive and negative review of systems were noted in the above  HPI section.  All other review of systems was otherwise negative.  Outpatient Encounter Medications as of 05/23/2020  Medication Sig  . amoxicillin-clavulanate (AUGMENTIN) 875-125 MG tablet Take 1 tablet by mouth 2 (two) times daily.  Marland Kitchen buPROPion (WELLBUTRIN SR) 150 MG 12 hr tablet Take 150 mg by mouth daily.  . fluticasone (FLONASE) 50 MCG/ACT nasal spray Place 1 spray into the nose as needed.  Marland Kitchen losartan (COZAAR) 25 MG tablet Take 75 mg by mouth daily.  . Multiple Vitamin (MULTI-VITAMIN) tablet Take 1 tablet by mouth daily.  . Na Sulfate-K Sulfate-Mg Sulf 17.5-3.13-1.6 GM/177ML SOLN Take 1 kit by mouth once for 1 dose. Apply Coupon=BIN: [id number] PCN: CN GROUP: [id number] ID: [id number]; NO prior authorization  . ondansetron (ZOFRAN-ODT) 8 MG disintegrating tablet Take 1 tablet (8 mg total) by mouth every 8 (eight) hours as needed for nausea.  . traMADol (ULTRAM) 50 MG tablet Take 1 tablet (50 mg total) by mouth every 6 (six) hours as needed.  . triamcinolone cream (KENALOG) 0.5 % Apply 1 application topically 2 (two) times daily. To affected areas. (Patient taking differently: Apply 1 application topically as needed. To affected areas.)  . valACYclovir (VALTREX) 1000 MG tablet Take 2 tablets by mouth 2 (two) times daily as needed.  . [DISCONTINUED] buPROPion (WELLBUTRIN XL) 150 MG 24 hr tablet Take 1 tablet (150 mg total) by mouth every morning.  . [DISCONTINUED] diazepam (VALIUM) 5 MG tablet Take 0.5-1 tablets (2.5-5 mg total) by mouth every 8 (eight) hours as needed for anxiety (vertigo/dizziness).  . [DISCONTINUED] losartan (COZAAR)  100 MG tablet Take 75 mg by mouth daily.   No facility-administered encounter medications on file as of 05/23/2020.   No Known Allergies Patient Active Problem List   Diagnosis Date Noted  . Abdominal mass 04/03/2020  . Premature menopause 08/15/2019  . Eye exam, routine 08/15/2019  . Diverticulitis 08/15/2019   Social History   Socioeconomic History  .  Marital status: Married    Spouse name: Not on file  . Number of children: 3  . Years of education: Not on file  . Highest education level: Not on file  Occupational History  . Occupation: Facilities manager: [employer]  Tobacco Use  . Smoking status: Former Smoker    Types: Cigarettes  . Smokeless tobacco: Never Used  Vaping Use  . Vaping Use: Never used  Substance and Sexual Activity  . Alcohol use: Yes    Alcohol/week: 0.0 standard drinks    Comment: occ  . Drug use: Never  . Sexual activity: Yes    Partners: Male  Other Topics Concern  . Not on file  Social History Narrative  . Not on file   Social Determinants of Health   Financial Resource Strain: Not on file  Food Insecurity: Not on file  Transportation Needs: Not on file  Physical Activity: Not on file  Stress: Not on file  Social Connections: Not on file  Intimate Partner Violence: Not on file    Ms. Meadow family history includes Cirrhosis in her maternal uncle; Heart attack in her maternal uncle; High blood pressure in her father, mother, and sister; Kidney disease in her cousin, cousin, and sister; Ovarian cancer in her maternal aunt.      Objective:    Vitals:   05/23/20 1023  BP: 130/88  Pulse: 76  SpO2: 99%    Physical Exam Well-developed well-nourished  WF  in no acute distress.Pleasant   Height, [id number] , BMI 30.2  HEENT; nontraumatic normocephalic, EOMI, PE R LA, sclera anicteric. Oropharynx; not done Neck; supple, no JVD Cardiovascular; regular rate and rhythm with S1-S2, no murmur rub or gallop Pulmonary; Clear bilaterally Abdomen; soft, she is quite tender deep in the left lower quadrant and in the suprapubic area no definite rebound, incisional ports healing, nondistended, no palpable mass or hepatosplenomegaly, bowel sounds are active Rectal;not done Skin; benign exam, no jaundice rash or appreciable lesions Extremities; no clubbing cyanosis or edema skin warm and dry Neuro/Psych;  alert and oriented x4, grossly nonfocal mood and affect appropriate       Assessment & Plan:   #35 55 year old white female with recent diagnosis of 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  Patient is status post laparoscopic appendectomy and excision of a retroperitoneal soft tissue mass 04/16/2020/[facility].  She is referred for EGD and colonoscopy to rule out colonic or gastric primary.  Negative screening colonoscopy 2016/[facility]  #2 prior history of possible diverticulitis #3 acute left lower quadrant and suprapubic pain x1 week somewhat progressive, significant pain, unable to sleep last night.  No associated fever chills or changes in bowel habits, no dysuria. Etiology of her acute symptoms is not clear, this could be diverticulitis however she is in the postop period 1 month out from lap appendectomy and excision of the retroperitoneal mass and therefore need to rule out complication such as abscess, or other intra-abdominal inflammatory process.  #4 status post remote right nephrectomy for congenital abnormality  Plan; Patient will be scheduled for CT scan  of the abdomen pelvis with contrast tomorrow. We will start Augmentin 875 p.o. twice daily x10 days.  Patient did not tolerate metronidazole in the past. Start tramadol 50 mg every 6 hours as needed for pain Will go ahead and schedule for colonoscopy and EGD with Dr. Henrene Pastor.  Both procedures were discussed in detail with the patient including indications risks benefits and she is agreeable to proceed.  These will intentionally be scheduled about 3 weeks out to allow time for further work-up and management of potential diverticulitis.  Amy Genia Harold PA-C 05/23/2020   Cc: Emeterio Reeve, DO

## 2020-05-24 ENCOUNTER — Other Ambulatory Visit: Payer: Self-pay

## 2020-05-24 ENCOUNTER — Ambulatory Visit (INDEPENDENT_AMBULATORY_CARE_PROVIDER_SITE_OTHER)
Admission: RE | Admit: 2020-05-24 | Discharge: 2020-05-24 | Disposition: A | Discharge status: Home / Self Care | Payer: 59 | Source: Ambulatory Visit | Attending: Physician Assistant | Admitting: Physician Assistant

## 2020-05-24 DIAGNOSIS — C7A8 Other malignant neuroendocrine tumors: Secondary | ICD-10-CM | POA: Diagnosis not present

## 2020-05-24 DIAGNOSIS — R1032 Left lower quadrant pain: Secondary | ICD-10-CM

## 2020-05-24 DIAGNOSIS — R109 Unspecified abdominal pain: Secondary | ICD-10-CM | POA: Diagnosis not present

## 2020-05-24 MED ORDER — IOHEXOL 300 MG/ML  SOLN
100.0000 mL | Freq: Once | INTRAMUSCULAR | Status: AC | PRN
  Administered 2020-05-24: 100 mL via INTRAVENOUS

## 2020-05-28 DIAGNOSIS — C7A8 Other malignant neuroendocrine tumors: Secondary | ICD-10-CM | POA: Diagnosis not present

## 2020-05-28 DIAGNOSIS — R590 Localized enlarged lymph nodes: Secondary | ICD-10-CM | POA: Diagnosis not present

## 2020-05-30 MED FILL — buPROPion HCL ER (XL) 150 M: 150 | 90 days supply | Qty: 90 | Fill #1

## 2020-06-11 ENCOUNTER — Encounter: Payer: Self-pay | Admitting: Internal Medicine

## 2020-06-11 ENCOUNTER — Ambulatory Visit (AMBULATORY_SURGERY_CENTER): Payer: 59 | Admitting: Internal Medicine

## 2020-06-11 ENCOUNTER — Other Ambulatory Visit: Payer: Self-pay

## 2020-06-11 VITALS — BP 119/69 | HR 74 | Temp 97.3°F | Resp 11 | Ht 65.0 in | Wt 181.0 lb

## 2020-06-11 DIAGNOSIS — R1032 Left lower quadrant pain: Secondary | ICD-10-CM

## 2020-06-11 DIAGNOSIS — Z1211 Encounter for screening for malignant neoplasm of colon: Secondary | ICD-10-CM

## 2020-06-11 DIAGNOSIS — K449 Diaphragmatic hernia without obstruction or gangrene: Secondary | ICD-10-CM

## 2020-06-11 DIAGNOSIS — K2970 Gastritis, unspecified, without bleeding: Secondary | ICD-10-CM | POA: Diagnosis not present

## 2020-06-11 DIAGNOSIS — K3189 Other diseases of stomach and duodenum: Secondary | ICD-10-CM | POA: Diagnosis not present

## 2020-06-11 DIAGNOSIS — K635 Polyp of colon: Secondary | ICD-10-CM

## 2020-06-11 DIAGNOSIS — K259 Gastric ulcer, unspecified as acute or chronic, without hemorrhage or perforation: Secondary | ICD-10-CM

## 2020-06-11 DIAGNOSIS — D122 Benign neoplasm of ascending colon: Secondary | ICD-10-CM

## 2020-06-11 DIAGNOSIS — K222 Esophageal obstruction: Secondary | ICD-10-CM | POA: Diagnosis not present

## 2020-06-11 DIAGNOSIS — Z86010 Personal history of colonic polyps: Secondary | ICD-10-CM | POA: Diagnosis not present

## 2020-06-11 DIAGNOSIS — C7A8 Other malignant neuroendocrine tumors: Secondary | ICD-10-CM

## 2020-06-11 MED ORDER — SODIUM CHLORIDE 0.9 % IV SOLN
500.0000 mL | Freq: Once | INTRAVENOUS | Status: DC
Start: 2020-06-11 — End: 2021-02-12

## 2020-06-11 NOTE — Progress Notes (Signed)
PT taken to PACU. Monitors in place. VSS. Report given to RN. 

## 2020-06-11 NOTE — Progress Notes (Signed)
Called to room to assist during endoscopic procedure.  Patient ID and intended procedure confirmed with present staff. Received instructions for my participation in the procedure from the performing physician.  

## 2020-06-11 NOTE — Patient Instructions (Signed)
YOU HAD AN ENDOSCOPIC PROCEDURE TODAY AT THE [facility]:   Refer to the procedure report that was given to you for any specific questions about what was found during the examination.  If the procedure report does not answer your questions, please call your gastroenterologist to clarify.  If you requested that your care partner not be given the details of your procedure findings, then the procedure report has been included in a sealed envelope for you to review at your convenience later.  YOU SHOULD EXPECT: Some feelings of bloating in the abdomen. Passage of more gas than usual.  Walking can help get rid of the air that was put into your GI tract during the procedure and reduce the bloating. If you had a lower endoscopy (such as a colonoscopy or flexible sigmoidoscopy) you may notice spotting of blood in your stool or on the toilet paper. If you underwent a bowel prep for your procedure, you may not have a normal bowel movement for a few days.  Please Note:  You might notice some irritation and congestion in your nose or some drainage.  This is from the oxygen used during your procedure.  There is no need for concern and it should clear up in a day or so.  SYMPTOMS TO REPORT IMMEDIATELY:  Following lower endoscopy (colonoscopy or flexible sigmoidoscopy):  Excessive amounts of blood in the stool  Significant tenderness or worsening of abdominal pains  Swelling of the abdomen that is new, acute  Fever of 100F or higher  Following upper endoscopy (EGD)  Vomiting of blood or coffee ground material  New chest pain or pain under the shoulder blades  Painful or persistently difficult swallowing  New shortness of breath  Fever of 100F or higher  Black, tarry-looking stools  For urgent or emergent issues, a gastroenterologist can be reached at any hour by calling [phone]. Do not use MyChart messaging for urgent concerns.    DIET:  We do recommend a small meal at first, but  then you may proceed to your regular diet.  Drink plenty of fluids but you should avoid alcoholic beverages for 24 hours.  MEDICATIONS: Continue present medications.  Please see handouts given to you by your recovery nurse.  Thank you for allowing us to provide for your healthcare needs today.  ACTIVITY:  You should plan to take it easy for the rest of today and you should NOT DRIVE or use heavy machinery until tomorrow (because of the sedation medicines used during the test).    FOLLOW UP: Our staff will call the number listed on your records 48-72 hours following your procedure to check on you and address any questions or concerns that you may have regarding the information given to you following your procedure. If we do not reach you, we will leave a message.  We will attempt to reach you two times.  During this call, we will ask if you have developed any symptoms of COVID 19. If you develop any symptoms (ie: fever, flu-like symptoms, shortness of breath, cough etc.) before then, please call [phone].  If you test positive for Covid 19 in the 2 weeks post procedure, please call and report this information to us.    If any biopsies were taken you will be contacted by phone or by letter within the next 1-3 weeks.  Please call us at [phone] if you have not heard about the biopsies in 3 weeks.    SIGNATURES/CONFIDENTIALITY: You and/or your   and/or your care partner have signed paperwork which will be entered into your electronic medical record.  These signatures attest to the fact that that the information above on your After Visit Summary has been reviewed and is understood.  Full responsibility of the confidentiality of this discharge information lies with you and/or your care-partner.

## 2020-06-11 NOTE — Op Note (Signed)
[name] Procedure Date: 06/11/2020 2:21 PM MRN: [id number] Endoscopist: Docia Chuck. Henrene Pastor , MD Age: 55 Referring MD:  Date of Birth: Sep 13, 1965 Gender: Female Account #: [id number] Procedure:                Colonoscopy with cold snare polypectomy x 1 Indications:              Screening for colorectal malignant neoplasm.                            Negative examination elsewhere 2016. History of                            neuroendocrine tumor of the mesentery resected                            January 2022 Medicines:                Monitored Anesthesia Care Procedure:                Pre-Anesthesia Assessment:                           - Prior to the procedure, a History and Physical                            was performed, and patient medications and                            allergies were reviewed. The patient's tolerance of                            previous anesthesia was also reviewed. The risks                            and benefits of the procedure and the sedation                            options and risks were discussed with the patient.                            All questions were answered, and informed consent                            was obtained. Prior Anticoagulants: The patient has                            taken no previous anticoagulant or antiplatelet                            agents. ASA Grade Assessment: III - A patient with                            severe systemic disease. After reviewing the risks  and benefits, the patient was deemed in                            satisfactory condition to undergo the procedure.                           After obtaining informed consent, the colonoscope                            was passed under direct vision. Throughout the                            procedure, the patient's blood pressure, pulse, and                            oxygen saturations were  monitored continuously. The                            Olympus CF-HQ190 [id number]) Colonoscope was                            introduced through the anus and advanced to the the                            cecum, identified by appendiceal orifice and                            ileocecal valve. The terminal ileum, ileocecal                            valve, appendiceal orifice, and rectum were                            photographed. The quality of the bowel preparation                            was excellent. The colonoscopy was performed                            without difficulty. The patient tolerated the                            procedure well. The bowel preparation used was                            SUPREP via split dose instruction. Scope In: 2:38:47 PM Scope Out: 2:56:34 PM Scope Withdrawal Time: 0 hours 13 minutes 41 seconds  Total Procedure Duration: 0 hours 17 minutes 47 seconds  Findings:                 The terminal ileum appeared normal.                           A 4 mm polyp was found in the ascending colon. The  polyp was sessile. The polyp was removed with a                            cold snare. Resection and retrieval were complete.                           The exam was otherwise without abnormality on                            direct and retroflexion views. Complications:            No immediate complications. Estimated blood loss:                            None. Estimated Blood Loss:     Estimated blood loss: none. Impression:               - The examined portion of the ileum was normal.                           - One 4 mm polyp in the ascending colon, removed                            with a cold snare. Resected and retrieved.                           - The examination was otherwise normal on direct                            and retroflexion views. Recommendation:           - Repeat colonoscopy in 10 years for surveillance.                            - Patient has a contact number available for                            emergencies. The signs and symptoms of potential                            delayed complications were discussed with the                            patient. Return to normal activities tomorrow.                            Written discharge instructions were provided to the                            patient.                           - Resume previous diet.                           - Continue present medications.                           -  Await pathology results. Docia Chuck. Henrene Pastor, MD 06/11/2020 3:02:00 PM This report has been signed electronically.

## 2020-06-11 NOTE — Op Note (Signed)
[place] Patient Name: Lisa Glass Procedure Date: 06/11/2020 2:21 PM MRN: [id number] Endoscopist: Docia Chuck. 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 Now for EGD                            to rule out upper GI primary source Medicines:                Monitored Anesthesia Care Procedure:                Pre-Anesthesia Assessment:                           - Prior to the procedure, a History and Physical                            was performed, and patient medications and                            allergies were reviewed. The patient's tolerance of                            previous anesthesia was also reviewed. The risks                            and benefits of the procedure and the sedation                            options and risks were discussed with the patient.                            All questions were answered, and informed consent                            was obtained. Prior Anticoagulants: The patient has                            taken no previous anticoagulant or antiplatelet                            agents. ASA Grade Assessment: III - A patient with                            severe systemic disease. After reviewing the risks                            and benefits, the patient was deemed in  satisfactory condition to undergo the procedure.                           After obtaining informed consent, the endoscope was                            passed under direct vision. Throughout the                            procedure, the patient's blood pressure, pulse, and                            oxygen saturations were monitored continuously. The                             Endoscope was introduced through the mouth, and                            advanced to the second part of duodenum. The upper                            GI endoscopy was accomplished without difficulty.                            The patient tolerated the procedure well. Scope In: Scope Out: Findings:                 The esophagus revealed a large caliber distal                            esophageal ring. Esophagus was otherwise normal.                           The stomach revealed a small hiatal hernia. As well                            1 small linear erosion in the gastric antrum.                            Biopsies were taken with a cold forceps for                            histology.                           The examined duodenum was normal.                           The cardia and gastric fundus were normal on                            retroflexion. Complications:            No immediate complications. Estimated Blood Loss:     Estimated blood loss: none. Impression:  1. Incidental esophageal ring                           2. Isolated gastric erosion. Likely secondary to                            NSAIDs                           3. Otherwise unremarkable EGD. No evidence for                            neuroendocrine tumor Recommendation:           - Patient has a contact number available for                            emergencies. The signs and symptoms of potential                            delayed complications were discussed with the                            patient. Return to normal activities tomorrow.                            Written discharge instructions were provided to the                            patient.                           - Resume previous diet.                           - Continue present medications.                           - Await pathology results.                           - Return to the care of your primary team Docia Chuck.  Henrene Pastor, MD 06/11/2020 3:15:27 PM This report has been signed electronically.

## 2020-06-13 ENCOUNTER — Telehealth: Payer: Self-pay

## 2020-06-13 NOTE — Telephone Encounter (Signed)
  Follow up Call-  Call back number 06/11/2020  Post procedure Call Back phone  # [phone] or [phone]  Permission to leave phone message Yes  Some recent data might be hidden     Patient questions:  Do you have a fever, pain , or abdominal swelling? No. Pain Score  0 *  Have you tolerated food without any problems? Yes.    Have you been able to return to your normal activities? Yes.    Do you have any questions about your discharge instructions: Diet   No. Medications  No. Follow up visit  No.  Do you have questions or concerns about your Care? No.  Actions: * If pain score is 4 or above: No action needed, pain <4.   1. Have you developed a fever since your procedure? No   2.   Have you had an respiratory symptoms (SOB or cough) since your procedure? No   3.   Have you tested positive for COVID 19 since your procedure? No   4.   Have you had any family members/close contacts diagnosed with the COVID 19 since your procedure?  No    If yes to any of these questions please route to Joylene John, RN and Joella Prince, RN

## 2020-06-14 DIAGNOSIS — C7A8 Other malignant neuroendocrine tumors: Secondary | ICD-10-CM | POA: Diagnosis not present

## 2020-06-14 DIAGNOSIS — Z905 Acquired absence of kidney: Secondary | ICD-10-CM | POA: Diagnosis not present

## 2020-06-14 DIAGNOSIS — F419 Anxiety disorder, unspecified: Secondary | ICD-10-CM | POA: Diagnosis not present

## 2020-06-14 DIAGNOSIS — I10 Essential (primary) hypertension: Secondary | ICD-10-CM | POA: Diagnosis not present

## 2020-06-14 DIAGNOSIS — E785 Hyperlipidemia, unspecified: Secondary | ICD-10-CM | POA: Diagnosis not present

## 2020-06-14 DIAGNOSIS — R7401 Elevation of levels of liver transaminase levels: Secondary | ICD-10-CM | POA: Diagnosis not present

## 2020-06-14 DIAGNOSIS — F32A Depression, unspecified: Secondary | ICD-10-CM | POA: Diagnosis not present

## 2020-06-14 DIAGNOSIS — Z86010 Personal history of colonic polyps: Secondary | ICD-10-CM | POA: Diagnosis not present

## 2020-06-14 DIAGNOSIS — Z79899 Other long term (current) drug therapy: Secondary | ICD-10-CM | POA: Diagnosis not present

## 2020-06-18 ENCOUNTER — Encounter: Payer: Self-pay | Admitting: Internal Medicine

## 2020-06-18 ENCOUNTER — Ambulatory Visit (INDEPENDENT_AMBULATORY_CARE_PROVIDER_SITE_OTHER): Payer: 59

## 2020-06-18 ENCOUNTER — Other Ambulatory Visit (HOSPITAL_BASED_OUTPATIENT_CLINIC_OR_DEPARTMENT_OTHER): Payer: Self-pay | Admitting: Osteopathic Medicine

## 2020-06-18 ENCOUNTER — Ambulatory Visit (INDEPENDENT_AMBULATORY_CARE_PROVIDER_SITE_OTHER): Payer: 59 | Admitting: Osteopathic Medicine

## 2020-06-18 ENCOUNTER — Encounter: Payer: Self-pay | Admitting: Osteopathic Medicine

## 2020-06-18 ENCOUNTER — Other Ambulatory Visit: Payer: Self-pay

## 2020-06-18 VITALS — BP 143/97 | HR 69 | Temp 97.7°F | Wt 184.0 lb

## 2020-06-18 DIAGNOSIS — M25461 Effusion, right knee: Secondary | ICD-10-CM

## 2020-06-18 DIAGNOSIS — M1711 Unilateral primary osteoarthritis, right knee: Secondary | ICD-10-CM | POA: Diagnosis not present

## 2020-06-18 DIAGNOSIS — M25561 Pain in right knee: Secondary | ICD-10-CM

## 2020-06-18 DIAGNOSIS — Z905 Acquired absence of kidney: Secondary | ICD-10-CM | POA: Diagnosis not present

## 2020-06-18 DIAGNOSIS — M7989 Other specified soft tissue disorders: Secondary | ICD-10-CM | POA: Diagnosis not present

## 2020-06-18 MED ORDER — OXYCODONE-ACETAMINOPHEN 5-325 MG PO TABS: 1.0000 | ORAL_TABLET | Freq: Four times a day (QID) | ORAL | 0 refills | Status: DC | PRN

## 2020-06-18 MED ORDER — MELOXICAM 15 MG PO TABS: 15.0000 mg | ORAL_TABLET | Freq: Every day | ORAL | 0 refills | Status: DC

## 2020-06-18 NOTE — Patient Instructions (Addendum)
Meloxicam daily x1 week Oxycodone-acetaminophen as needed Rest the knee  Ice, compress, elevate!

## 2020-06-18 NOTE — Progress Notes (Signed)
Lisa Glass is a 55 y.o. female who presents to  [place] at [facility]  today, 06/18/20, seeking care for the following:  . R knee pain - got up from her chair the other day, felt pop/click in lateral R knee. No obvious swelling. Pain now w/ extension and w/ weight bearing. Recent BMP reviewed, renal fxn ok.  . On exam, pain w/ knee extension, she can localize pain to lateral knee just anterior to fibular head. Normal patellar glide. No joint laxity. Pain w/  [place]' both directions.      ASSESSMENT & PLAN with other pertinent findings:  The primary encounter diagnosis was Acute pain of right knee. A diagnosis of Single kidney was also pertinent to this visit.    Patient Instructions  Meloxicam daily x1 week Oxycodone-acetaminophen as needed Rest the knee  Ice, compress, elevate!        Orders Placed This Encounter  Procedures  . DG Knee Complete 4 Views Right    Meds ordered this encounter  Medications  . meloxicam (MOBIC) 15 MG tablet    Sig: Take 1 tablet (15 mg total) by mouth daily.    Dispense:  30 tablet    Refill:  0  . oxyCODONE-acetaminophen (PERCOCET) 5-325 MG tablet    Sig: Take 1-2 tablets by mouth every 6 (six) hours as needed for severe pain. Use sparingly to avoid tolerance/dependence    Dispense:  10 tablet    Refill:  0     See below for relevant physical exam findings  See below for recent lab and imaging results reviewed  Medications, allergies, PMH, PSH, SocH, [place] reviewed below    Follow-up instructions: Return in about 1 week (around 06/25/2020) for VISIT WITH SPORTS MEDICINE FOR KNEE ISSUE.                                        Exam:  BP (!) 143/97 (BP Location: Left Arm, Patient Position: Sitting, Cuff Size: Normal)   Pulse 69   Temp 97.7 F (36.5 C) (Oral)   Wt 184 lb (83.5 kg)   BMI 30.62 kg/m   Constitutional: VS see above. General  Appearance: alert, well-developed, well-nourished, NAD  Neck: No masses, trachea midline.   Respiratory: Normal respiratory effor  Musculoskeletal: see above.  Neurological: Normal balance/coordination. No tremor.  Skin: warm, dry, intact.   Psychiatric: Normal judgment/insight. Normal mood and affect. Oriented x3.   Current Meds  Medication Sig  . buPROPion (WELLBUTRIN SR) 150 MG 12 hr tablet Take 150 mg by mouth daily.  . fluticasone (FLONASE) 50 MCG/ACT nasal spray Place 1 spray into the nose as needed.  Marland Kitchen losartan (COZAAR) 25 MG tablet Take 75 mg by mouth daily.  . meloxicam (MOBIC) 15 MG tablet Take 1 tablet (15 mg total) by mouth daily.  . Multiple Vitamin (MULTI-VITAMIN) tablet Take 1 tablet by mouth daily.  . ondansetron (ZOFRAN-ODT) 8 MG disintegrating tablet Take 1 tablet (8 mg total) by mouth every 8 (eight) hours as needed for nausea.  Marland Kitchen oxyCODONE-acetaminophen (PERCOCET) 5-325 MG tablet Take 1-2 tablets by mouth every 6 (six) hours as needed for severe pain. Use sparingly to avoid tolerance/dependence  . triamcinolone cream (KENALOG) 0.5 % Apply 1 application topically 2 (two) times daily. To affected areas. (Patient taking differently: Apply 1 application topically as needed. To affected areas.)  .  valACYclovir (VALTREX) 1000 MG tablet Take 2 tablets by mouth 2 (two) times daily as needed.  . [DISCONTINUED] traMADol (ULTRAM) 50 MG tablet Take 1 tablet (50 mg total) by mouth every 6 (six) hours as needed.   Current Facility-Administered Medications for the 06/18/20 encounter (Office Visit) with Emeterio Reeve, DO  Medication  . 0.9 %  sodium chloride infusion    No Known Allergies  Patient Active Problem List   Diagnosis Date Noted  . Abdominal mass 04/03/2020  . Premature menopause 08/15/2019  . Eye exam, routine 08/15/2019  . Diverticulitis 08/15/2019    Family History  Problem Relation Age of Onset  . High blood pressure Mother   . High blood pressure  Father   . High blood pressure Sister        Twin  . Kidney disease Sister        solitary kidney  . Ovarian cancer Maternal Aunt   . Heart attack Maternal Uncle   . Kidney disease Cousin   . Kidney disease Cousin   . Cirrhosis Maternal Uncle        w/ liver transplant  . Colon cancer Neg Hx   . Esophageal cancer Neg Hx   . Pancreatic cancer Neg Hx   . Stomach cancer Neg Hx   . Colon polyps Neg Hx     Social History   Tobacco Use  Smoking Status Former Smoker  . Types: Cigarettes  . Quit date: 04/06/1985  . Years since quitting: 35.2  Smokeless Tobacco Never Used    Past Surgical History:  Procedure Laterality Date  . APPENDECTOMY     04/16/20, excision of right lower quadrant mesenteric mass  . ENDOMETRIAL ABLATION    . kidney     removal  . OTHER SURGICAL HISTORY     Biopsy R inguinal 05/28/20  . URETERAL REIMPLANTION Bilateral     Immunization History  Administered Date(s) Administered  . Influenza, Seasonal, Injecte, Preservative Fre 01/13/2016, 01/14/2017, 01/13/2018  . Influenza-Unspecified 01/27/2013, 02/02/2014, 01/17/2015, 01/13/2016  . PFIZER(Purple Top)SARS-COV-2 Vaccination 04/01/2019, 04/24/2019, 01/12/2020  . Tdap 01/05/2011  . Zoster Recombinat (Shingrix) 08/15/2019    Recent Results (from the past 2160 hour(s))  CBC with Differential/Platelet     Status: None   Collection Time: 03/21/20 12:00 AM  Result Value Ref Range   WBC 5.5 3.8 - 10.8 Thousand/uL   RBC 4.29 3.80 - 5.10 Million/uL   Hemoglobin 14.0 11.7 - 15.5 g/dL   HCT 40.5 35.0 - 45.0 %   MCV 94.4 80.0 - 100.0 fL   MCH 32.6 27.0 - 33.0 pg   MCHC 34.6 32.0 - 36.0 g/dL   RDW 12.9 11.0 - 15.0 %   Platelets 221 140 - 400 Thousand/uL   MPV 12.5 7.5 - 12.5 fL   Neutro Abs 3,069 1,500 - 7,800 cells/uL   Lymphs Abs 1,958 850 - 3,900 cells/uL   Absolute Monocytes 369 200 - 950 cells/uL   Eosinophils Absolute 77 15 - 500 cells/uL   Basophils Absolute 28 0 - 200 cells/uL   Neutrophils  Relative % 55.8 %   Total Lymphocyte 35.6 %   Monocytes Relative 6.7 %   Eosinophils Relative 1.4 %   Basophils Relative 0.5 %  COMPLETE METABOLIC PANEL WITH GFR     Status: None   Collection Time: 03/21/20 12:00 AM  Result Value Ref Range   Glucose, Bld 97 65 - 99 mg/dL    Comment: .  Fasting reference interval .    BUN 19 7 - 25 mg/dL   Creat 0.91 0.50 - 1.05 mg/dL    Comment: For patients >55 years of age, the reference limit for Creatinine is approximately 13% higher for people identified as African-American. .    GFR, Est Non African American 73 > OR = 60 mL/min/1.37m2   GFR, Est African American 84 > OR = 60 mL/min/1.4m2   BUN/Creatinine Ratio NOT APPLICABLE 6 - 22 (calc)   Sodium 137 135 - 146 mmol/L   Potassium 4.4 3.5 - 5.3 mmol/L   Chloride 103 98 - 110 mmol/L   CO2 27 20 - 32 mmol/L   Calcium 9.5 8.6 - 10.4 mg/dL   Total Protein 6.7 6.1 - 8.1 g/dL   Albumin 4.4 3.6 - 5.1 g/dL   Globulin 2.3 1.9 - 3.7 g/dL (calc)   AG Ratio 1.9 1.0 - 2.5 (calc)   Total Bilirubin 0.7 0.2 - 1.2 mg/dL   Alkaline phosphatase (APISO) 73 37 - 153 U/L   AST 22 10 - 35 U/L   ALT 12 6 - 29 U/L  TSH     Status: None   Collection Time: 03/21/20 12:00 AM  Result Value Ref Range   TSH 1.06 mIU/L    Comment:           Reference Range .           > or = 20 Years  0.40-4.50 .                Pregnancy Ranges           First trimester    0.26-2.66           Second trimester   0.55-2.73           Third trimester    0.43-2.91   Urine Microscopic     Status: None   Collection Time: 03/21/20  8:03 AM  Result Value Ref Range   WBC, UA 0-5 0 - 5 /HPF   RBC / HPF 0-2 0 - 2 /HPF   Squamous Epithelial / LPF NONE SEEN < OR = 5 /HPF   Bacteria, UA NONE SEEN NONE SEEN /HPF   Hyaline Cast NONE SEEN NONE SEEN /LPF  Microalbumin / creatinine urine ratio     Status: Abnormal   Collection Time: 03/21/20  8:03 AM  Result Value Ref Range   Creatinine, Urine 90 20 - 275 mg/dL   Microalb,  Ur 6.2 mg/dL    Comment: Reference Range Not established    Microalb Creat Ratio 69 (H) <30 mcg/mg creat    Comment: . The ADA defines abnormalities in albumin excretion as follows: Marland Kitchen Albuminuria Category        Result (mcg/mg creatinine) . Normal to Mildly increased   <30 Moderately increased         30-299  Severely increased           > OR = 300 . The ADA recommends that at least two of three specimens collected within a 3-6 month period be abnormal before considering a patient to be within a diagnostic category.   Urinalysis     Status: None   Collection Time: 03/21/20  8:03 AM  Result Value Ref Range   Color, Urine YELLOW YELLOW   APPearance CLEAR CLEAR   Specific Gravity, Urine 1.019 1.001 - 1.03   pH 7.0 5.0 - 8.0   Glucose, UA NEGATIVE NEGATIVE   Bilirubin Urine  NEGATIVE NEGATIVE   Ketones, ur NEGATIVE NEGATIVE   Hgb urine dipstick NEGATIVE NEGATIVE   Protein, ur NEGATIVE NEGATIVE   Nitrite NEGATIVE NEGATIVE   Leukocytes,Ua NEGATIVE NEGATIVE  Lipase, blood     Status: None   Collection Time: 03/30/20  2:32 PM  Result Value Ref Range   Lipase 30 11 - 51 U/L    Comment: Performed at [facility], [place] [address] [place], [place] [address]  Comprehensive metabolic panel     Status: Abnormal   Collection Time: 03/30/20  2:32 PM  Result Value Ref Range   Sodium 139 135 - 145 mmol/L   Potassium 4.0 3.5 - 5.1 mmol/L   Chloride 103 98 - 111 mmol/L   CO2 25 22 - 32 mmol/L   Glucose, Bld 98 70 - 99 mg/dL    Comment: Glucose reference range applies only to samples taken after fasting for at least 8 hours.   BUN 12 6 - 20 mg/dL   Creatinine, Ser 1.14 (H) 0.44 - 1.00 mg/dL   Calcium 9.6 8.9 - 10.3 mg/dL   Total Protein 7.4 6.5 - 8.1 g/dL   Albumin 4.6 3.5 - 5.0 g/dL   AST 23 15 - 41 U/L   ALT 15 0 - 44 U/L   Alkaline Phosphatase 72 38 - 126 U/L   Total Bilirubin 0.5 0.3 - 1.2 mg/dL   GFR, Estimated 58 (L) >60 mL/min    Comment: (NOTE) Calculated using the  CKD-EPI Creatinine Equation (2021)    Anion gap 11 5 - 15    Comment: Performed at [place] [address], [place] [address]  CBC     Status: None   Collection Time: 03/30/20  2:32 PM  Result Value Ref Range   WBC 6.8 4.0 - 10.5 K/uL   RBC 4.45 3.87 - 5.11 MIL/uL   Hemoglobin 14.1 12.0 - 15.0 g/dL   HCT 43.0 36.0 - 46.0 %   MCV 96.6 80.0 - 100.0 fL   MCH 31.7 26.0 - 34.0 pg   MCHC 32.8 30.0 - 36.0 g/dL   RDW 12.6 11.5 - 15.5 %   Platelets 228 150 - 400 K/uL   nRBC 0.0 0.0 - 0.2 %    Comment: Performed at [facility], [place] [address] [place], [place] [address]  I-Stat beta hCG blood, ED     Status: None   Collection Time: 03/30/20  2:38 PM  Result Value Ref Range   I-stat hCG, quantitative <5.0 <5 mIU/mL   Comment 3            Comment:   GEST. AGE      CONC.  (mIU/mL)   <=1 WEEK        5 - 50     2 WEEKS       50 - 500     3 WEEKS       100 - 10,000     4 WEEKS     1,000 - 30,000        FEMALE AND NON-PREGNANT FEMALE:     LESS THAN 5 mIU/mL   Urine culture     Status: Abnormal   Collection Time: 03/30/20  3:33 PM   Specimen: Urine, Random  Result Value Ref Range   Specimen Description URINE, RANDOM    Special Requests      NONE Performed at [facility], [place] [address] [place], [place] [address]  Culture MULTIPLE SPECIES PRESENT, SUGGEST RECOLLECTION (A)    Report Status 03/31/2020 FINAL   Urinalysis, Routine w reflex microscopic Urine, Random     Status: Abnormal   Collection Time: 03/30/20  5:40 PM  Result Value Ref Range   Color, Urine YELLOW YELLOW   APPearance HAZY (A) CLEAR   Specific Gravity, Urine 1.021 1.005 - 1.030   pH 7.0 5.0 - 8.0   Glucose, UA NEGATIVE NEGATIVE mg/dL   Hgb urine dipstick NEGATIVE NEGATIVE   Bilirubin Urine NEGATIVE NEGATIVE   Ketones, ur NEGATIVE NEGATIVE mg/dL   Protein, ur 30 (A) NEGATIVE mg/dL   Nitrite NEGATIVE NEGATIVE   Leukocytes,Ua MODERATE (A) NEGATIVE   RBC / HPF 6-10 0 - 5 RBC/hpf    WBC, UA 21-50 0 - 5 WBC/hpf   Bacteria, UA NONE SEEN NONE SEEN   Squamous Epithelial / LPF 0-5 0 - 5   Mucus PRESENT     Comment: Performed at [facility], [address] [place], [place] [address]    No results found.     All questions at time of visit were answered - patient instructed to contact office with any additional concerns or updates. ER/RTC precautions were reviewed with the patient as applicable.   Please note: manual typing as well as voice recognition software may have been used to produce this document - typos may escape review. Please contact Dr. Sheppard Coil for any needed clarifications.

## 2020-06-19 ENCOUNTER — Encounter: Payer: Self-pay | Admitting: Osteopathic Medicine

## 2020-06-25 ENCOUNTER — Other Ambulatory Visit (HOSPITAL_COMMUNITY): Payer: Self-pay | Admitting: Sports Medicine

## 2020-06-25 ENCOUNTER — Ambulatory Visit (INDEPENDENT_AMBULATORY_CARE_PROVIDER_SITE_OTHER): Payer: 59

## 2020-06-25 ENCOUNTER — Ambulatory Visit: Payer: 59 | Admitting: Sports Medicine

## 2020-06-25 ENCOUNTER — Other Ambulatory Visit: Payer: Self-pay

## 2020-06-25 DIAGNOSIS — M2391 Unspecified internal derangement of right knee: Secondary | ICD-10-CM

## 2020-06-25 DIAGNOSIS — M1711 Unilateral primary osteoarthritis, right knee: Secondary | ICD-10-CM | POA: Insufficient documentation

## 2020-06-25 MED ORDER — TRIAZOLAM 0.25 MG PO TABS: ORAL_TABLET | ORAL | 0 refills | Status: DC

## 2020-06-25 MED FILL — TRIAZOLAM 0.25 MG TABS: 0.25 | 1 days supply | Qty: 2 | Fill #0

## 2020-06-25 NOTE — Assessment & Plan Note (Addendum)
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 Unfortunately she was getting up from a seated position and felt a sharp catching pain in her right knee. She was treated conservatively as appropriate and referred to me. Unfortunately she still has difficulty extending her knee with approximately 10 degrees of extension lag. There is likely torn meniscus or a joint mouse, today we injected her knee, and I am going to set her up with MRI, hopefully on Sunday. Triazolam for preprocedural anxiolysis. Return to see me in about a month.

## 2020-06-25 NOTE — Progress Notes (Signed)
    Procedures performed today:    Procedure: Real-time Ultrasound Guided injection of the right knee Device: Samsung HS60  Verbal informed consent obtained.  Time-out conducted.  Noted no overlying erythema, induration, or other signs of local infection.  Skin prepped in a sterile fashion.  Local anesthesia: Topical Ethyl chloride.  With sterile technique and under real time ultrasound guidance:  Noted a bit of an effusion, 1 cc Kenalog 40, 2 cc lidocaine, 2 cc bupivacaine injected easily Completed without difficulty  Advised to call if fevers/chills, erythema, induration, drainage, or persistent bleeding.  Images permanently stored and available for review in PACS.  Impression: Technically successful ultrasound guided injection.  Independent interpretation of notes and tests performed by another provider:   None.  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 Unfortunately she was getting up from a seated position and felt a sharp catching pain in her right knee. She was treated conservatively as appropriate and referred to me. Unfortunately she still has difficulty extending her knee with approximately 10 degrees of extension lag. There is likely torn meniscus or a joint mouse, today we injected her knee, and I am going to set her up with MRI, hopefully on Sunday. Triazolam for preprocedural anxiolysis. Return to see me in about a month.    ___________________________________________ Gwen Her. Dianah Field, M.D., ABFM., CAQSM. Primary Care and [place] Instructor of [place] of [facility] of Medicine

## 2020-06-28 ENCOUNTER — Other Ambulatory Visit (HOSPITAL_BASED_OUTPATIENT_CLINIC_OR_DEPARTMENT_OTHER): Payer: Self-pay

## 2020-06-30 ENCOUNTER — Other Ambulatory Visit: Payer: Self-pay

## 2020-06-30 ENCOUNTER — Ambulatory Visit (INDEPENDENT_AMBULATORY_CARE_PROVIDER_SITE_OTHER): Payer: 59

## 2020-06-30 DIAGNOSIS — M25461 Effusion, right knee: Secondary | ICD-10-CM | POA: Diagnosis not present

## 2020-06-30 DIAGNOSIS — M2391 Unspecified internal derangement of right knee: Secondary | ICD-10-CM

## 2020-06-30 DIAGNOSIS — M8548 Solitary bone cyst, other site: Secondary | ICD-10-CM | POA: Diagnosis not present

## 2020-06-30 DIAGNOSIS — M1711 Unilateral primary osteoarthritis, right knee: Secondary | ICD-10-CM | POA: Diagnosis not present

## 2020-07-08 DIAGNOSIS — D3A00 Benign carcinoid tumor of unspecified site: Secondary | ICD-10-CM | POA: Insufficient documentation

## 2020-07-08 DIAGNOSIS — D3A8 Other benign neuroendocrine tumors: Secondary | ICD-10-CM

## 2020-07-08 HISTORY — DX: Other benign neuroendocrine tumors: D3A.8

## 2020-07-24 ENCOUNTER — Telehealth: Payer: Self-pay | Admitting: Sports Medicine

## 2020-07-24 ENCOUNTER — Other Ambulatory Visit (HOSPITAL_COMMUNITY): Payer: Self-pay

## 2020-07-24 ENCOUNTER — Other Ambulatory Visit: Payer: Self-pay

## 2020-07-24 ENCOUNTER — Ambulatory Visit: Payer: 59 | Admitting: Sports Medicine

## 2020-07-24 DIAGNOSIS — M1711 Unilateral primary osteoarthritis, right knee: Secondary | ICD-10-CM | POA: Diagnosis not present

## 2020-07-24 MED ORDER — TRAMADOL HCL 50 MG PO TABS
50.0000 mg | ORAL_TABLET | Freq: Three times a day (TID) | ORAL | 0 refills | Status: DC | PRN
  Filled 2020-07-24: qty 30, 5d supply, fill #0

## 2020-07-24 MED ORDER — ACETAMINOPHEN ER 650 MG PO TBCR: 650.0000 mg | EXTENDED_RELEASE_TABLET | Freq: Three times a day (TID) | ORAL | 3 refills | Status: DC | PRN

## 2020-07-24 NOTE — Telephone Encounter (Signed)
Lisa Glass has x-ray and MRI confirmed osteoarthritis of the right knee, failed injections, oral analgesics, lets get her approved for viscosupplementation on the right knee.

## 2020-07-24 NOTE — Assessment & Plan Note (Signed)
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 I first saw her a month ago, she was getting up from a seated position and felt a sharp catch, she was treated conservatively, then referred to me, she had about 10 degrees of extension lag, we injected her knee and obtained an MRI. She had a week of good relief followed by recurrence of swelling, pain, not much catching. MRI showed mostly osteoarthritis without obvious meniscal tearing, though she does have a good sized cartilage defect in the medial femoral condyle. At this point I would like a surgical opinion from Dr. Berenice Primas, she will do Tylenol with tramadol for breakthrough pain. Has a solitary kidney so avoiding NSAIDs. She also has a history of neuroendocrine malignancy, and is going to be scheduled for a right hemicolectomy. She is wondering if a knee scope is needed if it could be done during her month off of work after her hemicolectomy. Considering GI malignancy, and upcoming orthopedic surgery I would recommend perioperative Eliquis, she will discuss this with her PCP. We will also work on getting her approved for viscosupplementation which would be a good option after her knee scope. She can ice and compress the knee in the meantime.

## 2020-07-24 NOTE — Progress Notes (Signed)
    Procedures performed today:    None.  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 I first saw her a month ago, she was getting up from a seated position and felt a sharp catch, she was treated conservatively, then referred to me, she had about 10 degrees of extension lag, we injected her knee and obtained an MRI. She had a week of good relief followed by recurrence of swelling, pain, not much catching. MRI showed mostly osteoarthritis without obvious meniscal tearing, though she does have a good sized cartilage defect in the medial femoral condyle. At this point I would like a surgical opinion from Dr. Berenice Primas, she will do Tylenol with tramadol for breakthrough pain. Has a solitary kidney so avoiding NSAIDs. She also has a history of neuroendocrine malignancy, and is going to be scheduled for a right hemicolectomy. She is wondering if a knee scope is needed if it could be done during her month off of work after her hemicolectomy. Considering GI malignancy, and upcoming orthopedic surgery I would recommend perioperative Eliquis, she will discuss this with her PCP. We will also work on getting her approved for viscosupplementation which would be a good option after her knee scope. She can ice and compress the knee in the meantime.    ___________________________________________ Gwen Her. Dianah Field, M.D., ABFM., CAQSM. Primary Care and [place] Instructor of [place] of [facility] of Medicine

## 2020-07-29 NOTE — Telephone Encounter (Signed)
I sent through [employer] Waiting on BID and to see if PA is required. - CF

## 2020-08-03 MED FILL — Losartan Potassium Tab 25 MG: ORAL | 30 days supply | Qty: 30 | Fill #0 | Status: CN

## 2020-08-05 ENCOUNTER — Other Ambulatory Visit (HOSPITAL_COMMUNITY): Payer: Self-pay

## 2020-08-06 DIAGNOSIS — R1011 Right upper quadrant pain: Secondary | ICD-10-CM | POA: Diagnosis not present

## 2020-08-06 DIAGNOSIS — C7A8 Other malignant neuroendocrine tumors: Secondary | ICD-10-CM | POA: Diagnosis not present

## 2020-08-06 DIAGNOSIS — R110 Nausea: Secondary | ICD-10-CM | POA: Diagnosis not present

## 2020-08-07 DIAGNOSIS — C7A8 Other malignant neuroendocrine tumors: Secondary | ICD-10-CM | POA: Diagnosis not present

## 2020-08-07 DIAGNOSIS — R1011 Right upper quadrant pain: Secondary | ICD-10-CM | POA: Diagnosis not present

## 2020-08-07 DIAGNOSIS — Z905 Acquired absence of kidney: Secondary | ICD-10-CM | POA: Diagnosis not present

## 2020-08-07 DIAGNOSIS — Z9289 Personal history of other medical treatment: Secondary | ICD-10-CM | POA: Diagnosis not present

## 2020-08-13 ENCOUNTER — Other Ambulatory Visit (HOSPITAL_COMMUNITY): Payer: Self-pay

## 2020-08-14 ENCOUNTER — Other Ambulatory Visit (HOSPITAL_COMMUNITY): Payer: Self-pay

## 2020-08-14 MED FILL — Losartan Potassium Tab 25 MG: ORAL | 30 days supply | Qty: 30 | Fill #0 | Status: AC

## 2020-08-22 DIAGNOSIS — Z7689 Persons encountering health services in other specified circumstances: Secondary | ICD-10-CM | POA: Diagnosis not present

## 2020-08-22 DIAGNOSIS — Z905 Acquired absence of kidney: Secondary | ICD-10-CM | POA: Diagnosis not present

## 2020-08-22 DIAGNOSIS — I10 Essential (primary) hypertension: Secondary | ICD-10-CM | POA: Diagnosis not present

## 2020-09-05 ENCOUNTER — Other Ambulatory Visit: Payer: Self-pay | Admitting: Osteopathic Medicine

## 2020-09-05 ENCOUNTER — Other Ambulatory Visit: Payer: Self-pay

## 2020-09-05 NOTE — Telephone Encounter (Signed)
Received BID patient covered at 100% and required a Prior auth. I am going to submit and get patient scheduled. - CF

## 2020-09-06 ENCOUNTER — Other Ambulatory Visit (HOSPITAL_COMMUNITY): Payer: Self-pay

## 2020-09-06 MED ORDER — BUPROPION HCL ER (XL) 150 MG PO TB24
ORAL_TABLET | Freq: Every morning | ORAL | 1 refills | Status: DC
  Filled 2020-09-06: qty 90, 90d supply, fill #0
  Filled 2020-12-28: qty 90, 90d supply, fill #1

## 2020-09-06 MED ORDER — LOSARTAN POTASSIUM 25 MG PO TABS
ORAL_TABLET | ORAL | 2 refills | Status: DC
  Filled 2020-09-06: qty 90, 90d supply, fill #0
  Filled 2021-01-08: qty 90, 90d supply, fill #1
  Filled 2021-05-20: qty 90, 90d supply, fill #2

## 2020-09-06 MED ORDER — LOSARTAN POTASSIUM 50 MG PO TABS
ORAL_TABLET | ORAL | 2 refills | Status: DC
  Filled 2020-09-06: qty 90, 90d supply, fill #0
  Filled 2021-04-05: qty 90, 90d supply, fill #1
  Filled 2021-07-21: qty 90, 90d supply, fill #2

## 2020-09-07 ENCOUNTER — Other Ambulatory Visit (HOSPITAL_COMMUNITY): Payer: Self-pay

## 2020-09-09 ENCOUNTER — Other Ambulatory Visit (HOSPITAL_COMMUNITY): Payer: Self-pay

## 2020-09-10 ENCOUNTER — Other Ambulatory Visit (HOSPITAL_COMMUNITY): Payer: Self-pay

## 2020-09-10 NOTE — Telephone Encounter (Signed)
I called Patient to let her know I received Auth for her injections she is getting ready to go out of the [place] for a week and states her knee is feeling better. She Is going to schedule an appointment with Dr T when she returns to see what his thoughts are and if she needs the injections.   Auth: [id number] Valid 09/07/20 - 03/08/21  We can do injections at a later date if patient and Dr T decides they are still needed. - CF

## 2020-09-11 ENCOUNTER — Other Ambulatory Visit (HOSPITAL_COMMUNITY): Payer: Self-pay

## 2020-09-23 DIAGNOSIS — E785 Hyperlipidemia, unspecified: Secondary | ICD-10-CM | POA: Diagnosis not present

## 2020-09-23 DIAGNOSIS — F329 Major depressive disorder, single episode, unspecified: Secondary | ICD-10-CM | POA: Diagnosis not present

## 2020-09-23 DIAGNOSIS — Z905 Acquired absence of kidney: Secondary | ICD-10-CM | POA: Diagnosis not present

## 2020-09-23 DIAGNOSIS — F419 Anxiety disorder, unspecified: Secondary | ICD-10-CM | POA: Diagnosis not present

## 2020-09-23 DIAGNOSIS — Z01818 Encounter for other preprocedural examination: Secondary | ICD-10-CM | POA: Diagnosis not present

## 2020-09-23 DIAGNOSIS — I10 Essential (primary) hypertension: Secondary | ICD-10-CM | POA: Diagnosis not present

## 2020-09-23 DIAGNOSIS — D3A00 Benign carcinoid tumor of unspecified site: Secondary | ICD-10-CM | POA: Diagnosis not present

## 2020-09-26 ENCOUNTER — Other Ambulatory Visit (HOSPITAL_COMMUNITY): Payer: Self-pay

## 2020-09-26 DIAGNOSIS — D3A00 Benign carcinoid tumor of unspecified site: Secondary | ICD-10-CM | POA: Diagnosis not present

## 2020-09-26 DIAGNOSIS — Z01812 Encounter for preprocedural laboratory examination: Secondary | ICD-10-CM | POA: Diagnosis not present

## 2020-09-26 MED ORDER — METRONIDAZOLE 500 MG PO TABS
ORAL_TABLET | ORAL | 0 refills | Status: DC
  Filled 2020-09-26: qty 2, 1d supply, fill #0

## 2020-09-26 MED ORDER — NEOMYCIN SULFATE 500 MG PO TABS
ORAL_TABLET | ORAL | 0 refills | Status: DC
  Filled 2020-09-26: qty 4, 1d supply, fill #0

## 2020-10-02 DIAGNOSIS — Z79899 Other long term (current) drug therapy: Secondary | ICD-10-CM | POA: Diagnosis not present

## 2020-10-02 DIAGNOSIS — Z6831 Body mass index (BMI) 31.0-31.9, adult: Secondary | ICD-10-CM | POA: Diagnosis not present

## 2020-10-02 DIAGNOSIS — E669 Obesity, unspecified: Secondary | ICD-10-CM | POA: Diagnosis not present

## 2020-10-02 DIAGNOSIS — C7A020 Malignant carcinoid tumor of the appendix: Secondary | ICD-10-CM | POA: Diagnosis not present

## 2020-10-02 DIAGNOSIS — E785 Hyperlipidemia, unspecified: Secondary | ICD-10-CM | POA: Diagnosis not present

## 2020-10-02 DIAGNOSIS — Z8249 Family history of ischemic heart disease and other diseases of the circulatory system: Secondary | ICD-10-CM | POA: Diagnosis not present

## 2020-10-02 DIAGNOSIS — Z905 Acquired absence of kidney: Secondary | ICD-10-CM | POA: Diagnosis not present

## 2020-10-02 DIAGNOSIS — I10 Essential (primary) hypertension: Secondary | ICD-10-CM | POA: Diagnosis not present

## 2020-10-02 DIAGNOSIS — D3A00 Benign carcinoid tumor of unspecified site: Secondary | ICD-10-CM | POA: Diagnosis not present

## 2020-10-02 DIAGNOSIS — C7A012 Malignant carcinoid tumor of the ileum: Secondary | ICD-10-CM | POA: Diagnosis not present

## 2020-10-02 DIAGNOSIS — D3A020 Benign carcinoid tumor of the appendix: Secondary | ICD-10-CM | POA: Diagnosis not present

## 2020-10-02 DIAGNOSIS — G8918 Other acute postprocedural pain: Secondary | ICD-10-CM | POA: Diagnosis not present

## 2020-10-04 ENCOUNTER — Other Ambulatory Visit (HOSPITAL_COMMUNITY): Payer: Self-pay

## 2020-10-04 MED ORDER — OXYCODONE HCL 5 MG PO TABS
ORAL_TABLET | ORAL | 0 refills | Status: DC
  Filled 2020-10-04: qty 15, 4d supply, fill #0

## 2020-10-05 ENCOUNTER — Other Ambulatory Visit (HOSPITAL_COMMUNITY): Payer: Self-pay

## 2020-10-05 DIAGNOSIS — Z9089 Acquired absence of other organs: Secondary | ICD-10-CM | POA: Diagnosis not present

## 2020-10-05 DIAGNOSIS — E785 Hyperlipidemia, unspecified: Secondary | ICD-10-CM | POA: Diagnosis not present

## 2020-10-05 DIAGNOSIS — R110 Nausea: Secondary | ICD-10-CM | POA: Diagnosis not present

## 2020-10-05 DIAGNOSIS — Z905 Acquired absence of kidney: Secondary | ICD-10-CM | POA: Diagnosis not present

## 2020-10-05 DIAGNOSIS — I10 Essential (primary) hypertension: Secondary | ICD-10-CM | POA: Diagnosis not present

## 2020-10-05 DIAGNOSIS — Z79899 Other long term (current) drug therapy: Secondary | ICD-10-CM | POA: Diagnosis not present

## 2020-10-05 DIAGNOSIS — Z9049 Acquired absence of other specified parts of digestive tract: Secondary | ICD-10-CM | POA: Diagnosis not present

## 2020-10-05 DIAGNOSIS — N2889 Other specified disorders of kidney and ureter: Secondary | ICD-10-CM | POA: Diagnosis not present

## 2020-10-05 DIAGNOSIS — K625 Hemorrhage of anus and rectum: Secondary | ICD-10-CM | POA: Diagnosis not present

## 2020-10-05 MED ORDER — LOPERAMIDE HCL 2 MG PO CAPS: ORAL_CAPSULE | ORAL | 0 refills | Status: DC

## 2020-10-08 ENCOUNTER — Other Ambulatory Visit: Payer: Self-pay | Admitting: *Deleted

## 2020-10-08 ENCOUNTER — Other Ambulatory Visit (HOSPITAL_COMMUNITY): Payer: Self-pay

## 2020-10-08 ENCOUNTER — Encounter: Payer: Self-pay | Admitting: *Deleted

## 2020-10-08 DIAGNOSIS — Z9049 Acquired absence of other specified parts of digestive tract: Secondary | ICD-10-CM | POA: Insufficient documentation

## 2020-10-08 DIAGNOSIS — C7A8 Other malignant neuroendocrine tumors: Secondary | ICD-10-CM | POA: Diagnosis not present

## 2020-10-08 HISTORY — DX: Acquired absence of other specified parts of digestive tract: Z90.49

## 2020-10-08 NOTE — Patient Outreach (Signed)
[facility]) Care Management  10/08/2020  Lisa Glass [birth year] [id number]   Transition of care call/case closure   Referral received:09/30/20 Initial outreach:10/08/20 Insurance: [employer]    Subjective: Initial successful telephone call to patient's preferred number in order to complete transition of care assessment; 2 HIPAA identifiers verified. Explained purpose of call and completed transition of care assessment.  Lisa Glass states that she is doing good. She denies post-operative problems, says surgical incisions are unremarkable, states surgical pain well managed with prescribed medications, tolerating diet appetite good,denies bowel or bladder problems. She discussed having episode of noting blood in stool noted blood in stool on 7/2, she report diagnosed as old blood, no further stool since then, passing gas and prescribed immodium as needed  Visit with oncology on this am. Spouse assisting with her  recovery. She reports tolerating mobility well in home and with walks.   Reviewed accessing the following [place] Benefits : She does not have the hospital indemnity, she is accessing FMLA benefits.  She uses  a [facility] outpatient pharmacy at [employer].     Objective:  Lisa Glass  was hospitalized at [facility] 6/29-10/04/20 for Robotic ileocolectomy Comorbidities include: Carcinoid tumor of mesoappendix , depression ,hyperlipidemia, hypertension ,right nephrectomy 1982, Laparoscopic appendectomy with excision of mesentric mass 04/16/20, neuroendocrine mass.  She was discharged to home on 10/04/20  without the need for home health services or DME.   Assessment:  Patient voices good understanding of all discharge instructions.  See transition of care flowsheet for assessment details.   Plan:  Reviewed hospital discharge diagnosis of Robotic ileocolectomy   and discharge treatment plan using hospital discharge instructions, assessing  medication adherence, reviewing problems requiring provider notification, and discussing the importance of follow up with surgeon, primary care provider and/or specialists as directed.  Reviewed [place] healthy lifestyle program information to receive discounted premium for  2023   Step 1: Get  your annual physical  Step 2: Complete your health assessment  Step 3:Identify your current health status and complete the corresponding action step between April 06, 2020 and December 05, 2020.     No ongoing care management needs identified so will close case to [place] [facility] services and route successful outreach letter with [place] [facility] pamphlet and 24 Hour Nurse Line Magnet to [place] [facility] clinical pool to be mailed to patient's home address.  Thanked patient for their services to [facility].  Joylene Draft, RN, BSN  [place] Management Coordinator  [phone]- Mobile [phone]- Toll Free Main Office

## 2020-10-14 ENCOUNTER — Other Ambulatory Visit: Payer: Self-pay | Admitting: Osteopathic Medicine

## 2020-10-14 DIAGNOSIS — Z1231 Encounter for screening mammogram for malignant neoplasm of breast: Secondary | ICD-10-CM

## 2020-10-16 ENCOUNTER — Ambulatory Visit (INDEPENDENT_AMBULATORY_CARE_PROVIDER_SITE_OTHER): Payer: 59

## 2020-10-16 ENCOUNTER — Other Ambulatory Visit: Payer: Self-pay

## 2020-10-16 DIAGNOSIS — Z1231 Encounter for screening mammogram for malignant neoplasm of breast: Secondary | ICD-10-CM

## 2020-12-28 ENCOUNTER — Other Ambulatory Visit (HOSPITAL_COMMUNITY): Payer: Self-pay

## 2021-01-01 ENCOUNTER — Other Ambulatory Visit (HOSPITAL_COMMUNITY): Payer: Self-pay

## 2021-01-08 ENCOUNTER — Other Ambulatory Visit (HOSPITAL_COMMUNITY): Payer: Self-pay

## 2021-01-08 DIAGNOSIS — C7A8 Other malignant neuroendocrine tumors: Secondary | ICD-10-CM | POA: Diagnosis not present

## 2021-01-08 DIAGNOSIS — C7A022 Malignant carcinoid tumor of the ascending colon: Secondary | ICD-10-CM | POA: Diagnosis not present

## 2021-01-08 DIAGNOSIS — R102 Pelvic and perineal pain: Secondary | ICD-10-CM | POA: Diagnosis not present

## 2021-02-12 ENCOUNTER — Other Ambulatory Visit: Payer: Self-pay

## 2021-02-12 ENCOUNTER — Ambulatory Visit (HOSPITAL_BASED_OUTPATIENT_CLINIC_OR_DEPARTMENT_OTHER)
Admission: RE | Admit: 2021-02-12 | Discharge: 2021-02-12 | Disposition: A | Discharge status: Home / Self Care | Payer: 59 | Source: Ambulatory Visit | Attending: Emergency Medicine | Admitting: Emergency Medicine

## 2021-02-12 ENCOUNTER — Ambulatory Visit
Admission: RE | Admit: 2021-02-12 | Discharge: 2021-02-12 | Disposition: A | Discharge status: Home / Self Care | Payer: 59 | Source: Ambulatory Visit | Attending: Emergency Medicine | Admitting: Emergency Medicine

## 2021-02-12 ENCOUNTER — Other Ambulatory Visit (HOSPITAL_COMMUNITY): Payer: Self-pay

## 2021-02-12 VITALS — BP 144/89 | HR 67 | Temp 98.4°F | Resp 18

## 2021-02-12 DIAGNOSIS — M79671 Pain in right foot: Secondary | ICD-10-CM | POA: Insufficient documentation

## 2021-02-12 DIAGNOSIS — M7731 Calcaneal spur, right foot: Secondary | ICD-10-CM | POA: Diagnosis not present

## 2021-02-12 DIAGNOSIS — M7989 Other specified soft tissue disorders: Secondary | ICD-10-CM | POA: Insufficient documentation

## 2021-02-12 MED ORDER — ACETAMINOPHEN 500 MG PO TABS
1000.0000 mg | ORAL_TABLET | Freq: Three times a day (TID) | ORAL | 2 refills | Status: AC
Start: 2021-02-12 — End: 2021-05-13
  Filled 2021-02-12: qty 180, 30d supply, fill #0

## 2021-02-12 NOTE — ED Triage Notes (Signed)
Pt report having right foot pain between 2nd and 3rd toe. Pt states it has been going on for about 2 weeks.

## 2021-02-12 NOTE — ED Provider Notes (Signed)
[facility]    CSN: [id number] Arrival date & time: 02/12/21  1006      History   Chief Complaint No chief complaint on file.   HPI Lisa Glass is a 55 y.o. female.   Pt report having right foot pain between 2nd and 3rd toe. Pt states it has been going on for about 2 weeks.  States that she does not have any pain at rest or with standing, has severe pain when she steps off to walk forward, states the pain is severe and is located on the top of her right foot, denies having any pain anywhere else in her foot at this time.  Patient denies known injury to foot.  Patient states she does walk a lot at her job.  The history is provided by the patient.   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   Primary osteoarthritis of one knee, right 06/25/2020   Abdominal mass 04/03/2020   Premature menopause 08/15/2019   Eye exam, routine 08/15/2019   Diverticulitis 08/15/2019    Past Surgical History:  Procedure Laterality Date   APPENDECTOMY     04/16/20, excision of right lower quadrant mesenteric mass   ENDOMETRIAL ABLATION     kidney     removal   OTHER SURGICAL HISTORY     Biopsy R inguinal 05/28/20   URETERAL REIMPLANTION Bilateral     OB History   No obstetric history on file.      Home Medications    Prior to Admission medications   Medication Sig Start Date End Date Taking? Authorizing Provider  acetaminophen (TYLENOL) 500 MG tablet Take 2 tablets (1,000 mg total) by mouth in the morning, at noon, and at bedtime. 02/12/21 05/13/21 Yes Lynden Oxford Scales, PA-C  buPROPion (WELLBUTRIN XL) 150 MG 24 hr tablet TAKE 1 TABLET BY MOUTH EVERY MORNING. 09/06/20 09/06/21  Emeterio Reeve, DO  losartan  (COZAAR) 25 MG tablet Take 75 mg by mouth daily.    [provider]  losartan (COZAAR) 25 MG tablet TAKE 1 TABLET BY MOUTH ONCE DAILY IN ADDITION TO 50 MG TABLET. 09/06/20     losartan (COZAAR) 50 MG tablet TAKE 1 TABLET BY MOUTH ONCE DAILY IN ADDITION TO 25 MG **MUST SCHEDULE OFFICE VISIT FOR FURTHER REFILLS** 11/17/19 11/16/20  Laurena Slimmer, MD  losartan (COZAAR) 50 MG tablet TAKE 1 TABLET BY MOUTH ONCE DAILY IN ADDITION TO 25 MG 09/06/20     Multiple Vitamin (MULTI-VITAMIN) tablet Take 1 tablet by mouth daily.    [provider]  Na Sulfate-K Sulfate-Mg Sulf 17.5-3.13-1.6 GM/177ML SOLN USE AS DIRECTED 05/23/20 05/23/21  Esterwood, Amy S, PA-C  triamcinolone cream (KENALOG) 0.5 % Apply 1 application topically 2 (two) times daily. To affected areas. Patient taking differently: Apply 1 application topically as needed. To affected areas. 03/21/20   Emeterio Reeve, DO  triamcinolone cream (KENALOG) 0.5 % APPLY TO THE AFFECTED AREA(S) 2 TIMES DAILY 03/21/20 03/21/21  Emeterio Reeve, DO  valACYclovir (VALTREX) 1000 MG tablet Take 2 tablets by mouth 2 (two) times daily as needed. 03/07/20   [provider]  Family History Family History  Problem Relation Age of Onset   High blood pressure Mother    High blood pressure Father    High blood pressure Sister        Twin   Kidney disease Sister        solitary kidney   Ovarian cancer Maternal Aunt    Heart attack Maternal Uncle    Kidney disease Cousin    Kidney disease Cousin    Cirrhosis Maternal Uncle        w/ liver transplant   Colon cancer Neg Hx    Esophageal cancer Neg Hx    Pancreatic cancer Neg Hx    Stomach cancer Neg Hx    Colon polyps Neg Hx     Social History Social History   Tobacco Use   Smoking status: Former    Types: Cigarettes    Quit date: 04/06/1985    Years since quitting: 35.8   Smokeless tobacco: Never  Vaping Use   Vaping Use: Never used  Substance Use Topics   Alcohol use:  Not Currently    Alcohol/week: 0.0 standard drinks   Drug use: Never     Allergies   Patient has no known allergies.   Review of Systems Review of Systems Pertinent findings noted in history of present illness.    Physical Exam Triage Vital Signs ED Triage Vitals  Enc Vitals Group     BP 01/31/21 0827 (!) 147/82     Pulse Rate 01/31/21 0827 72     Resp 01/31/21 0827 18     Temp 01/31/21 0827 98.3 F (36.8 C)     Temp Source 01/31/21 0827 Oral     SpO2 01/31/21 0827 98 %     Weight --      Height --      Head Circumference --      Peak Flow --      Pain Score 01/31/21 0826 5     Pain Loc --      Pain Edu? --      Excl. in [place]? --    No data found.  Updated Vital Signs BP (!) 144/89 (BP Location: Left Arm)   Pulse 67   Temp 98.4 F (36.9 C) (Oral)   Resp 18   SpO2 97%   Visual Acuity Right Eye Distance:   Left Eye Distance:   Bilateral Distance:    Right Eye Near:   Left Eye Near:    Bilateral Near:     Physical Exam Vitals and nursing note reviewed.  Constitutional:      General: She is not in acute distress.    Appearance: Normal appearance. She is not ill-appearing.  HENT:     Head: Normocephalic and atraumatic.  Eyes:     General: Lids are normal.        Right eye: No discharge.        Left eye: No discharge.     Extraocular Movements: Extraocular movements intact.     Conjunctiva/sclera: Conjunctivae normal.     Right eye: Right conjunctiva is not injected.     Left eye: Left conjunctiva is not injected.  Neck:     Trachea: Trachea and phonation normal.  Cardiovascular:     Rate and Rhythm: Normal rate and regular rhythm.     Pulses: Normal pulses.     Heart sounds: Normal heart sounds. No murmur heard.   No friction rub. No gallop.  Pulmonary:  Effort: Pulmonary effort is normal. No accessory muscle usage, prolonged expiration or respiratory distress.     Breath sounds: Normal breath sounds. No stridor, decreased air movement or  transmitted upper airway sounds. No decreased breath sounds, wheezing, rhonchi or rales.  Chest:     Chest wall: No tenderness.  Musculoskeletal:        General: Tenderness (Anterior foot at the second and third distal metatarsals) present. Normal range of motion.     Cervical back: Normal range of motion and neck supple. Normal range of motion.  Lymphadenopathy:     Cervical: No cervical adenopathy.  Skin:    General: Skin is warm and dry.     Findings: No erythema or rash.  Neurological:     General: No focal deficit present.     Mental Status: She is alert and oriented to person, place, and time.  Psychiatric:        Mood and Affect: Mood normal.        Behavior: Behavior normal.     UC Treatments / Results  Labs (all labs ordered are listed, but only abnormal results are displayed) Labs Reviewed - No data to display  EKG   Radiology DG Foot Complete Right  Result Date: 02/12/2021 CLINICAL DATA:  Two week history of foot pain and swelling. No known injury. EXAM: RIGHT FOOT COMPLETE - 3+ VIEW COMPARISON:  None. FINDINGS: The joint spaces are maintained. No degenerative or erosive findings. The bony structures are intact. No fractures or bone lesions. Small calcaneal heel spur. IMPRESSION: Normal right foot radiographs. Electronically Signed   By: Marijo Sanes M.D.   On: 02/12/2021 12:05    Procedures Procedures (including critical care time)  Medications Ordered in UC Medications - No data to display  Initial Impression / Assessment and Plan / UC Course  I have reviewed the triage vital signs and the nursing notes.  Pertinent labs & imaging results that were available during my care of the patient were reviewed by me and considered in my medical decision making (see chart for details).     Patient advised to go to outside imaging center to have her foot x-ray.  Patient is exquisitely tender on the anterior aspect of her foot, patient was placed in a postop boot  because the majority of her pain occurs when she steps off.  Patient provided with names of walk-in orthopedic clinics should the x-ray be abnormal.  Patient verbalized understanding and agreement of plan as discussed.  All questions were addressed during visit.  Please see discharge instructions below for further details of plan.  Final Clinical Impressions(s) / UC Diagnoses   Final diagnoses:  Right foot pain     Discharge Instructions      Please report to the med [place] facility to have your x-ray done, once I receive the report, reach out to you and let you know what it showed.  If there is an acute injury, please reach out to one of the 2 walk-in orthopedic clinics listed in your AVS so that you can be seen soon as possible.  As we discussed, chronic use of acetaminophen (Tylenol), no more than 3000 mg in a 24-hour period, is safe to take them very effective at relieving pain.  I sent a prescription for this, please begin taking now, anticipate relief after taking the third dose, make sure each dose is at least 6 hours apart.     ED Prescriptions     Medication  Sig Dispense Auth. Provider   acetaminophen (TYLENOL) 500 MG tablet Take 2 tablets (1,000 mg total) by mouth in the morning, at noon, and at bedtime. 180 tablet Lynden Oxford Scales, PA-C      PDMP not reviewed this encounter.   Disposition Upon Discharge:   The patient will follow up with their current PCP if and as advised. If the patient does not currently have a PCP we will assist them in obtaining one.   The patient may need specialty follow up if the symptoms continue, in spite of conservative treatment and management, for further workup, evaluation, consultation and treatment as clinically indicated and appropriate.  Return to the [facility] or PCP in 3-5 days if no better; to PCP or the Emergency Department if new signs and symptoms develop, or if the current signs or symptoms continue to change or  worsen for further workup, evaluation and treatment as clinically indicated and appropriate  Condition: stable for discharge home Home: take medications as prescribed; routine discharge instructions as discussed; follow up as advised.    Lynden Oxford Scales, PA-C 02/12/21 1457

## 2021-02-12 NOTE — Discharge Instructions (Signed)
Please report to the [place] facility to have your x-ray done, once I receive the report, reach out to you and let you know what it showed.  If there is an acute injury, please reach out to one of the 2 walk-in orthopedic clinics listed in your AVS so that you can be seen soon as possible.  As we discussed, chronic use of acetaminophen (Tylenol), no more than 3000 mg in a 24-hour period, is safe to take them very effective at relieving pain.  I sent a prescription for this, please begin taking now, anticipate relief after taking the third dose, make sure each dose is at least 6 hours apart.

## 2021-02-13 ENCOUNTER — Ambulatory Visit: Payer: 59 | Admitting: Sports Medicine

## 2021-02-19 DIAGNOSIS — M84374A Stress fracture, right foot, initial encounter for fracture: Secondary | ICD-10-CM | POA: Diagnosis not present

## 2021-02-25 ENCOUNTER — Other Ambulatory Visit: Payer: Self-pay

## 2021-03-06 ENCOUNTER — Other Ambulatory Visit (HOSPITAL_COMMUNITY): Payer: Self-pay

## 2021-03-21 ENCOUNTER — Other Ambulatory Visit: Payer: Self-pay

## 2021-03-21 ENCOUNTER — Other Ambulatory Visit (HOSPITAL_COMMUNITY): Payer: Self-pay

## 2021-03-21 ENCOUNTER — Emergency Department (INDEPENDENT_AMBULATORY_CARE_PROVIDER_SITE_OTHER)
Admission: RE | Admit: 2021-03-21 | Discharge: 2021-03-21 | Disposition: A | Discharge status: Home / Self Care | Payer: 59 | Source: Ambulatory Visit

## 2021-03-21 VITALS — BP 124/75 | HR 88 | Temp 97.9°F | Resp 18

## 2021-03-21 DIAGNOSIS — R21 Rash and other nonspecific skin eruption: Secondary | ICD-10-CM | POA: Diagnosis not present

## 2021-03-21 MED ORDER — PREDNISONE 20 MG PO TABS
ORAL_TABLET | ORAL | 0 refills | Status: DC
  Filled 2021-03-21: qty 15, 5d supply, fill #0

## 2021-03-21 MED ORDER — METHYLPREDNISOLONE SODIUM SUCC 125 MG IJ SOLR
125.0000 mg | Freq: Once | INTRAMUSCULAR | Status: AC
  Administered 2021-03-21: 125 mg via INTRAMUSCULAR

## 2021-03-21 NOTE — ED Triage Notes (Addendum)
Pt c/o facial redness and swelling that started Tuesday. Recently visited sister in [place]. Went to [place] on Monday where it was cold and windy, also used a new face wash at her sisters house. Says her face "feels like sandpaper". Triamcinolone cream prn.

## 2021-03-21 NOTE — Discharge Instructions (Addendum)
Advised to take medication as directed with food to completion.  Advised patient to take oral  Prednisone burst in 3 hours with food/lunch.  Advised patient to continue prednisone burst for the next 4 mornings.  Encouraged patient to increase daily water intake while taking these medications.

## 2021-03-21 NOTE — ED Provider Notes (Signed)
[name]    CSN: [id number] Arrival date & time: 03/21/21  0846      History   Chief Complaint Chief Complaint  Patient presents with   Facial redness    And swelling    HPI Lisa Glass is a 55 y.o. female.   HPI 55 year old female presents with facial redness and swelling 3 days.  Patient reports recently visited sister in [place] and went to [place] on Monday where he was cold and windy and used a new face wash at her sister's house reports that her face feels like sandpaper and has been using triamcinolone cream since.  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   Primary osteoarthritis of one knee, right 06/25/2020   Abdominal mass 04/03/2020   Premature menopause 08/15/2019   Eye exam, routine 08/15/2019   Diverticulitis 08/15/2019    Past Surgical History:  Procedure Laterality Date   APPENDECTOMY     04/16/20, excision of right lower quadrant mesenteric mass   ENDOMETRIAL ABLATION     kidney     removal   OTHER SURGICAL HISTORY     Biopsy R inguinal 05/28/20   URETERAL REIMPLANTION Bilateral     OB History   No obstetric history on file.      Home Medications    Prior to Admission medications   Medication Sig Start Date End Date Taking? Authorizing Provider  predniSONE (DELTASONE) 20 MG tablet Take 3 tablets by mouth daily for 5 days. 03/21/21  Yes Eliezer Lofts, FNP  acetaminophen (TYLENOL) 500 MG tablet Take 2 tablets (1,000 mg total) by mouth in the morning, at noon, and at bedtime. 02/12/21 05/13/21  Lynden Oxford Scales, PA-C  buPROPion (WELLBUTRIN XL) 150 MG 24 hr tablet TAKE 1 TABLET BY MOUTH EVERY MORNING. 09/06/20 09/06/21  Emeterio Reeve, DO  losartan  (COZAAR) 25 MG tablet Take 75 mg by mouth daily.    [provider]  losartan (COZAAR) 25 MG tablet TAKE 1 TABLET BY MOUTH ONCE DAILY IN ADDITION TO 50 MG TABLET. 09/06/20     losartan (COZAAR) 50 MG tablet TAKE 1 TABLET BY MOUTH ONCE DAILY IN ADDITION TO 25 MG **MUST SCHEDULE OFFICE VISIT FOR FURTHER REFILLS** 11/17/19 11/16/20  Laurena Slimmer, MD  losartan (COZAAR) 50 MG tablet TAKE 1 TABLET BY MOUTH ONCE DAILY IN ADDITION TO 25 MG 09/06/20     Multiple Vitamin (MULTI-VITAMIN) tablet Take 1 tablet by mouth daily.    [provider]  Na Sulfate-K Sulfate-Mg Sulf 17.5-3.13-1.6 GM/177ML SOLN USE AS DIRECTED 05/23/20 05/23/21  Esterwood, Amy S, PA-C  triamcinolone cream (KENALOG) 0.5 % Apply 1 application topically 2 (two) times daily. To affected areas. Patient taking differently: Apply 1 application topically as needed. To affected areas. 03/21/20   Emeterio Reeve, DO  triamcinolone cream (KENALOG) 0.5 % APPLY TO THE AFFECTED AREA(S) 2 TIMES DAILY 03/21/20 03/21/21  Emeterio Reeve, DO  valACYclovir (VALTREX) 1000 MG tablet Take 2 tablets by mouth 2 (two) times daily as needed. 03/07/20   [provider]    Family History Family History  Problem Relation Age of  Onset   High blood pressure Mother    High blood pressure Father    High blood pressure Sister        Twin   Kidney disease Sister        solitary kidney   Ovarian cancer Maternal Aunt    Heart attack Maternal Uncle    Kidney disease Cousin    Kidney disease Cousin    Cirrhosis Maternal Uncle        w/ liver transplant   Colon cancer Neg Hx    Esophageal cancer Neg Hx    Pancreatic cancer Neg Hx    Stomach cancer Neg Hx    Colon polyps Neg Hx     Social History Social History   Tobacco Use   Smoking status: Former    Types: Cigarettes    Quit date: 04/06/1985    Years since quitting: 35.9   Smokeless tobacco: Never  Vaping Use   Vaping Use: Never used  Substance Use Topics   Alcohol use:  Not Currently    Alcohol/week: 0.0 standard drinks   Drug use: Never     Allergies   Patient has no known allergies.   Review of Systems Review of Systems  Skin:  Positive for rash.    Physical Exam Triage Vital Signs ED Triage Vitals  Enc Vitals Group     BP 03/21/21 0853 124/75     Pulse Rate 03/21/21 0853 88     Resp 03/21/21 0853 18     Temp 03/21/21 0853 97.9 F (36.6 C)     Temp Source 03/21/21 0853 Oral     SpO2 03/21/21 0853 98 %     Weight --      Height --      Head Circumference --      Peak Flow --      Pain Score 03/21/21 0854 0     Pain Loc --      Pain Edu? --      Excl. in [place]? --    No data found.  Updated Vital Signs BP 124/75 (BP Location: Right Arm)    Pulse 88    Temp 97.9 F (36.6 C) (Oral)    Resp 18    SpO2 98%    Physical Exam Vitals and nursing note reviewed.  Constitutional:      General: She is not in acute distress.    Appearance: Normal appearance. She is obese. She is not ill-appearing.  HENT:     Head: Normocephalic and atraumatic.     Mouth/Throat:     Mouth: Mucous membranes are moist.     Pharynx: Oropharynx is clear.  Eyes:     Extraocular Movements: Extraocular movements intact.     Conjunctiva/sclera: Conjunctivae normal.     Pupils: Pupils are equal, round, and reactive to light.  Cardiovascular:     Rate and Rhythm: Normal rate and regular rhythm.     Pulses: Normal pulses.     Heart sounds: Normal heart sounds.  Pulmonary:     Effort: Pulmonary effort is normal.     Breath sounds: Normal breath sounds.  Musculoskeletal:     Cervical back: Normal range of motion and neck supple.  Skin:    Comments: Face (cheeks, upper eyelids, upper lip): Erythematous maculopapular eruption with mild soft tissue swelling noted, patient reports pruritic in nature.  Neurological:     General: No focal deficit present.     Mental Status: She is alert and  oriented to person, place, and time.     UC Treatments / Results   Labs (all labs ordered are listed, but only abnormal results are displayed) Labs Reviewed - No data to display  EKG   Radiology No results found.  Procedures Procedures (including critical care time)  Medications Ordered in UC Medications  methylPREDNISolone sodium succinate (SOLU-MEDROL) 125 mg/2 mL injection 125 mg (125 mg Intramuscular Given 03/21/21 0923)    Initial Impression / Assessment and Plan / UC Course  I have reviewed the triage vital signs and the nursing notes.  Pertinent labs & imaging results that were available during my care of the patient were reviewed by me and considered in my medical decision making (see chart for details).     MDM: 1.  Rash and nonspecific skin eruption-IM Solu-Medrol 125 mg given once in clinic prior to discharge, Rx'd Prednisone burst-Advised to take medication as directed with food to completion.  Advised patient to take oral  Prednisone burst in 3 hours with food/lunch.  Advised patient to continue prednisone burst for the next 4 mornings.  Encouraged patient to increase daily water intake while taking these medications.  Charged home, hemodynamically stable. Final Clinical Impressions(s) / UC Diagnoses   Final diagnoses:  Rash and nonspecific skin eruption     Discharge Instructions      Advised to take medication as directed with food to completion.  Advised patient to take oral  Prednisone burst in 3 hours with food/lunch.  Advised patient to continue prednisone burst for the next 4 mornings.  Encouraged patient to increase daily water intake while taking these medications.     ED Prescriptions     Medication Sig Dispense Auth. Provider   predniSONE (DELTASONE) 20 MG tablet Take 3 tablets by mouth daily for 5 days. 15 tablet Eliezer Lofts, FNP      PDMP not reviewed this encounter.   Eliezer Lofts, [place] 03/21/21 [phone]

## 2021-03-30 IMAGING — MR MR KNEE*R* W/O CM
7 series · 40 of 40 positions shown · non-contrast
Comparison: Radiographs 06/18/2020

CLINICAL DATA: Posterolateral knee pain with locking for 2 weeks.
No acute injury or prior relevant surgery.

EXAM:
MRI OF THE RIGHT KNEE WITHOUT CONTRAST
TECHNIQUE: Multiplanar, multisequence MR imaging of the knee was performed. No
intravenous contrast was administered.

[Series 3: T2 fat-sat · axial · 4.0mm · 0.53mm/px · z∈[-59,+86]mm · 6 of 30 slices shown (1 of 3)]
[im 1/30]
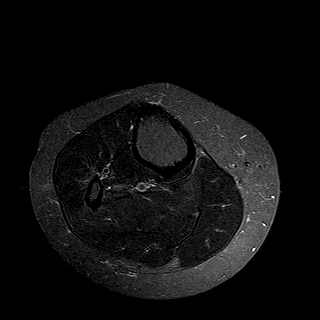
[im 6/30]
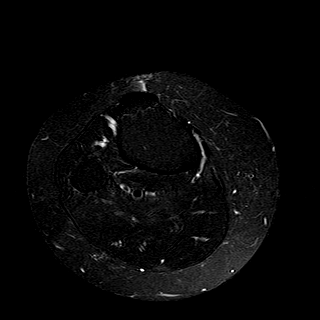
[im 12/30]
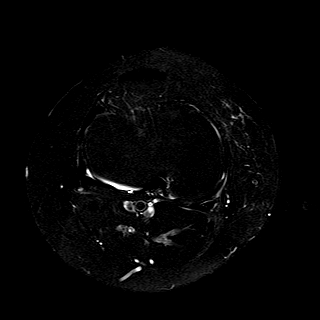
[im 18/30]
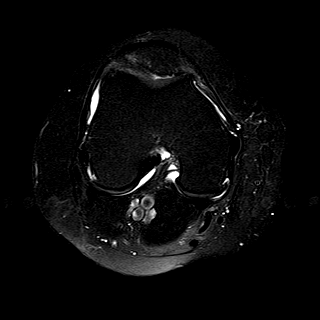
[im 24/30]
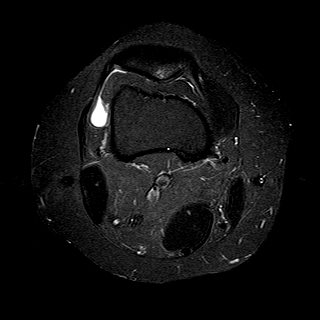
[im 30/30]
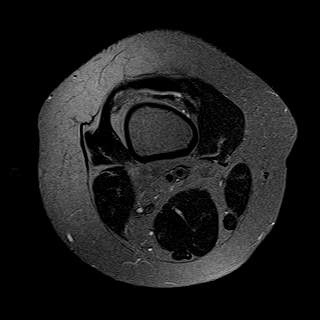

[Series 4: T1 · coronal · 4.0mm · 0.62mm/px · 6 of 28 slices shown]
[im 1/28]
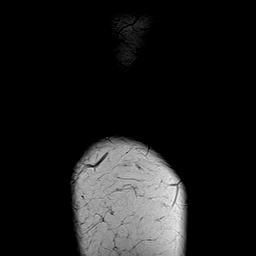
[im 6/28]
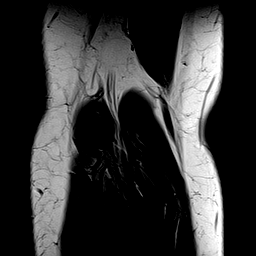
[im 11/28]
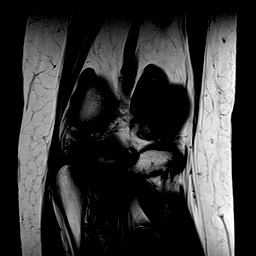
[im 17/28]
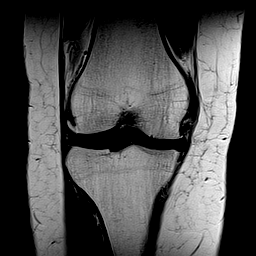
[im 22/28]
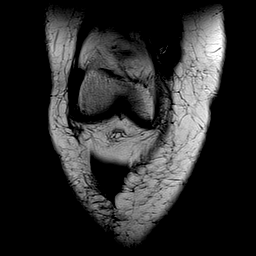
[im 28/28]
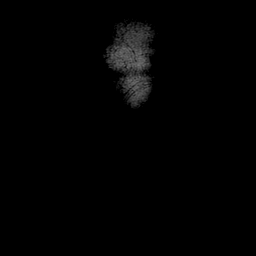

[Series 5: T2 fat-sat · coronal · 4.0mm · 0.62mm/px · 6 of 28 slices shown (2 of 3)]
[im 1/28]
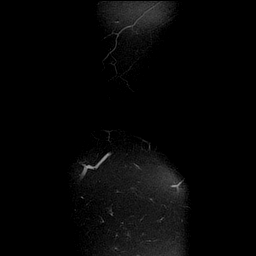
[im 6/28]
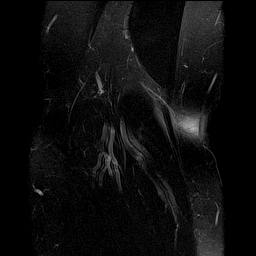
[im 11/28]
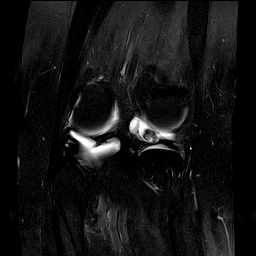
[im 17/28]
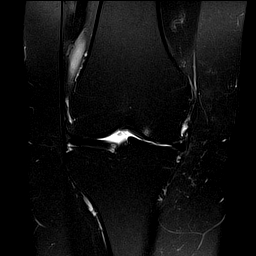
[im 22/28]
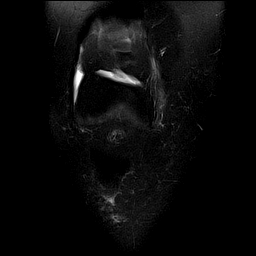
[im 28/28]
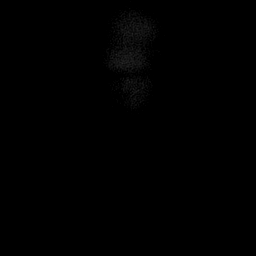

[Series 6: PD fat-sat · coronal · 4.0mm · 0.62mm/px · 6 of 28 slices shown (1 of 3)]
[im 1/28]
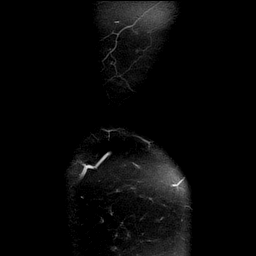
[im 6/28]
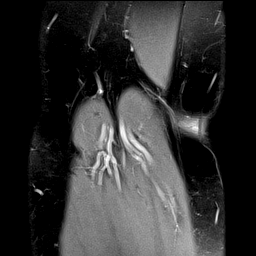
[im 11/28]
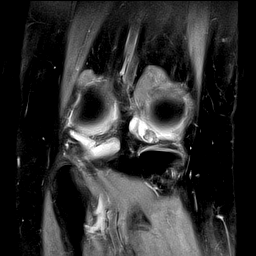
[im 17/28]
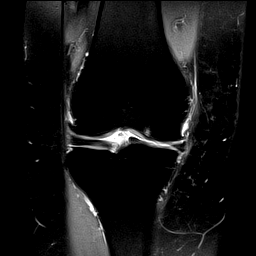
[im 22/28]
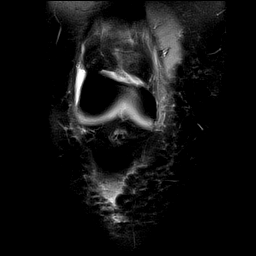
[im 28/28]
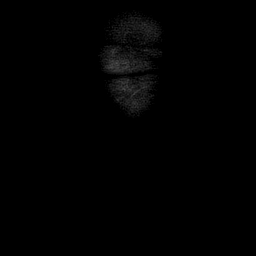

[Series 7: PD fat-sat · sagittal · 3.0mm · 0.62mm/px · 6 of 31 slices shown (2 of 3)]
[im 1/31]
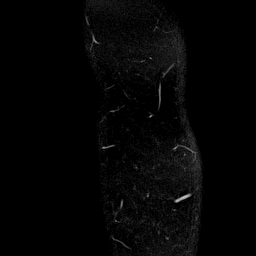
[im 7/31]
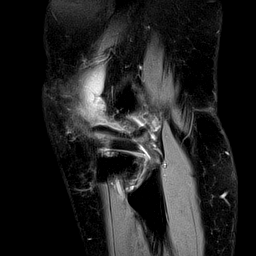
[im 13/31]
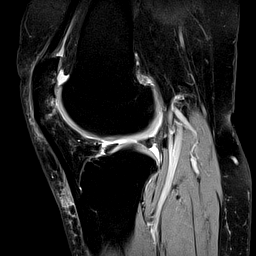
[im 19/31]
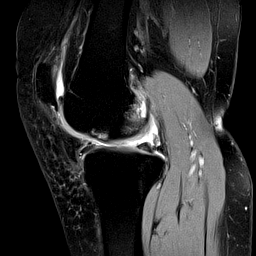
[im 25/31]
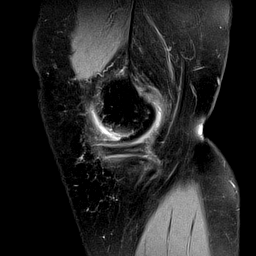
[im 31/31]
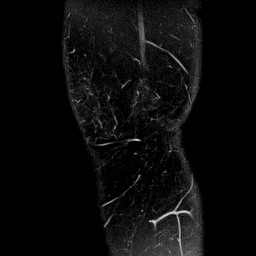

[Series 9: T2 fat-sat · sagittal · 3.0mm · 0.62mm/px · 6 of 31 slices shown (3 of 3)]
[im 1/31]
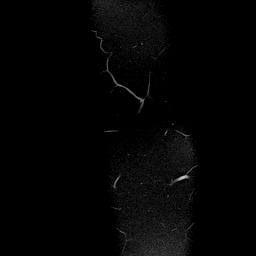
[im 7/31]
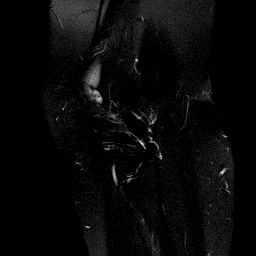
[im 13/31]
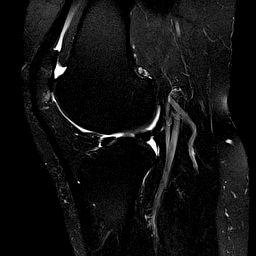
[im 19/31]
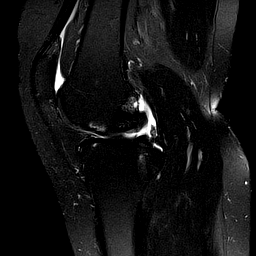
[im 25/31]
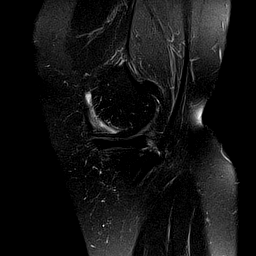
[im 31/31]
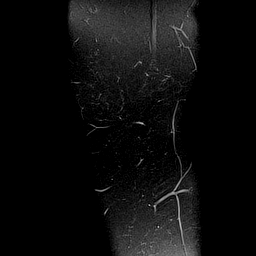

[Series 10: PD fat-sat · coronal · 2.0mm · 0.62mm/px · 4 of 19 slices shown (3 of 3)]
[im 1/19]
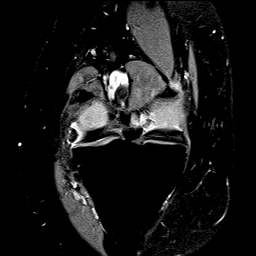
[im 7/19]
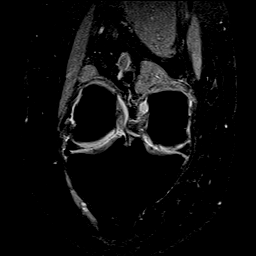
[im 13/19]
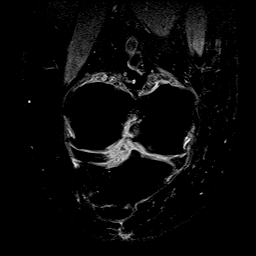
[im 19/19]
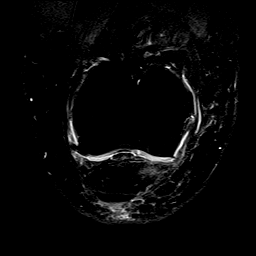

[40 of 40 positions shown; findings below may reference images not displayed]

FINDINGS: MENISCI

Medial meniscus:  Mildly degenerated without evidence of tear.

Lateral meniscus:  Intact with normal morphology.

LIGAMENTS

Cruciates:  Intact.

Collaterals:  Intact.

CARTILAGE

Patellofemoral: Moderate to advanced patellofemoral degenerative
changes with diffuse chondral thinning and subchondral cyst
formation, greatest over the lateral patellar facet.

Medial: Moderate osteoarthritis with chondral thinning, surface
irregularity and osteophyte formation. There is mild subchondral
cyst formation anteriorly in the medial femoral condyle.

Lateral: Mild chondral thinning, surface irregularity and osteophyte
formation.

MISCELLANEOUS

Joint:  Small joint effusion with mild synovial irregularity.

Popliteal Fossa:  Unremarkable. No significant Baker's cyst.

Extensor Mechanism:  Intact.

Bones:  No acute or significant extra-articular osseous findings.

Other: No other significant periarticular soft tissue findings.
IMPRESSION: 1. No acute findings or evidence of internal derangement. The
menisci, cruciate and collateral ligaments are intact.
2. At least moderate tricompartmental degenerative changes for age,
most advanced in the patellofemoral compartment.
3. Small joint effusion with mild synovial irregularity.

## 2021-04-06 ENCOUNTER — Other Ambulatory Visit (HOSPITAL_COMMUNITY): Payer: Self-pay

## 2021-04-07 ENCOUNTER — Other Ambulatory Visit (HOSPITAL_COMMUNITY): Payer: Self-pay

## 2021-04-11 DIAGNOSIS — J929 Pleural plaque without asbestos: Secondary | ICD-10-CM | POA: Diagnosis not present

## 2021-04-11 DIAGNOSIS — C7A8 Other malignant neuroendocrine tumors: Secondary | ICD-10-CM | POA: Diagnosis not present

## 2021-04-11 DIAGNOSIS — Z905 Acquired absence of kidney: Secondary | ICD-10-CM | POA: Diagnosis not present

## 2021-04-11 DIAGNOSIS — K6389 Other specified diseases of intestine: Secondary | ICD-10-CM | POA: Diagnosis not present

## 2021-04-16 DIAGNOSIS — C7A8 Other malignant neuroendocrine tumors: Secondary | ICD-10-CM | POA: Diagnosis not present

## 2021-05-05 ENCOUNTER — Other Ambulatory Visit (HOSPITAL_COMMUNITY): Payer: Self-pay

## 2021-05-05 MED ORDER — SODIUM FLUORIDE 1.1 % DT PSTE
PASTE | DENTAL | 6 refills | Status: AC
  Filled 2021-10-15: qty 100, 30d supply, fill #0
  Filled 2021-11-29: qty 100, 30d supply, fill #1
  Filled 2022-01-20: qty 100, 30d supply, fill #2
  Filled 2022-03-24: qty 100, 30d supply, fill #3

## 2021-05-20 ENCOUNTER — Other Ambulatory Visit (HOSPITAL_COMMUNITY): Payer: Self-pay

## 2021-06-16 ENCOUNTER — Other Ambulatory Visit: Payer: Self-pay

## 2021-06-16 ENCOUNTER — Other Ambulatory Visit (HOSPITAL_COMMUNITY): Payer: Self-pay

## 2021-06-16 ENCOUNTER — Encounter: Payer: Self-pay | Admitting: Medical-Surgical

## 2021-06-16 ENCOUNTER — Ambulatory Visit: Payer: 59 | Admitting: Medical-Surgical

## 2021-06-16 VITALS — BP 132/87 | HR 80 | Resp 20 | Ht 65.0 in | Wt 193.3 lb

## 2021-06-16 DIAGNOSIS — Z7689 Persons encountering health services in other specified circumstances: Secondary | ICD-10-CM

## 2021-06-16 DIAGNOSIS — I10 Essential (primary) hypertension: Secondary | ICD-10-CM | POA: Diagnosis not present

## 2021-06-16 MED ORDER — TOPIRAMATE 25 MG PO TABS
25.0000 mg | ORAL_TABLET | Freq: Two times a day (BID) | ORAL | 1 refills | Status: DC
  Filled 2021-06-16: qty 60, 30d supply, fill #0
  Filled 2021-08-03: qty 60, 30d supply, fill #1

## 2021-06-16 NOTE — Progress Notes (Signed)
?  HPI with pertinent ROS:  ? ?CC: transfer of care ? ?HPI: ?Pleasant 56 year old female presenting today to transfer care to a new PCP and to discuss weight loss. She is very concerned with her inability to lose weight and is interested in options that may help. Has several coworkers that are currently on Ozempic/Wegovy and has seen a lot about it on social media. Wonders if there is a medication option that will help kickstart her healthy eating habits and weight loss efforts. Not currently exercising. Admits that her eating behaviors could be better and she tends to eat junk at times. Also notes that when she is not working, she tends to drink wine.  ? ?HTN- taking Losartan '75mg'$  daily, tolerating well without side effects. Denies CP, SOB, palpitations, lower extremity edema, dizziness, headaches, or vision changes. ? ?I reviewed the past medical history, family history, social history, surgical history, and allergies today and no changes were needed.  Please see the problem list section below in epic for further details. ? ? ?Physical exam:  ? ?General: Well Developed, well nourished, and in no acute distress.  ?Neuro: Alert and oriented x3.  ?HEENT: Normocephalic, atraumatic.  ?Skin: Warm and dry. ?Cardiac: Regular rate and rhythm, no murmurs rubs or gallops, no lower extremity edema.  ?Respiratory: Clear to auscultation bilaterally. Not using accessory muscles, speaking in full sentences. ? ?Impression and Recommendations:   ? ?1. Encounter to establish care ?Reviewed available information and discussed care concerns with patient.  ? ?2. Essential hypertension ?BP at goal on recheck. Continue Losartan '75mg'$  daily.  ? ?3. Encounter for weight management ?Discussed various options. Insurance is definitely a limiting factor given her current health conditions. Reviewed options available. She previously took Wellbutrin and just recently got off of that so we will avoid restarting it for now. Starting Topamax '25mg'$   daily for 1 week then increase to '50mg'$  daily. Recommend starting regular intentional exercise at least 3 times weekly. Recommend a lowered calorie, heart healthy diet.  ? ?Return in about 6 weeks (around 07/28/2021) for weight check. ?___________________________________________ ?Clearnce Sorrel, DNP, APRN, FNP-BC ?Primary Care and Sports Medicine ?[place] ?

## 2021-06-16 NOTE — Patient Instructions (Signed)
Start Topamax at '25mg'$  once daily then increase to '50mg'$  daily.  ?

## 2021-06-17 DIAGNOSIS — R195 Other fecal abnormalities: Secondary | ICD-10-CM | POA: Diagnosis not present

## 2021-06-17 DIAGNOSIS — R109 Unspecified abdominal pain: Secondary | ICD-10-CM | POA: Diagnosis not present

## 2021-06-17 DIAGNOSIS — C7A022 Malignant carcinoid tumor of the ascending colon: Secondary | ICD-10-CM | POA: Diagnosis not present

## 2021-06-17 DIAGNOSIS — C7A8 Other malignant neuroendocrine tumors: Secondary | ICD-10-CM | POA: Diagnosis not present

## 2021-06-17 DIAGNOSIS — R1903 Right lower quadrant abdominal swelling, mass and lump: Secondary | ICD-10-CM | POA: Diagnosis not present

## 2021-06-18 ENCOUNTER — Other Ambulatory Visit (HOSPITAL_COMMUNITY): Payer: Self-pay

## 2021-07-01 DIAGNOSIS — C7A8 Other malignant neuroendocrine tumors: Secondary | ICD-10-CM | POA: Diagnosis not present

## 2021-07-01 DIAGNOSIS — R110 Nausea: Secondary | ICD-10-CM | POA: Diagnosis not present

## 2021-07-01 DIAGNOSIS — R102 Pelvic and perineal pain: Secondary | ICD-10-CM | POA: Diagnosis not present

## 2021-07-01 DIAGNOSIS — N3289 Other specified disorders of bladder: Secondary | ICD-10-CM | POA: Diagnosis not present

## 2021-07-01 DIAGNOSIS — K6389 Other specified diseases of intestine: Secondary | ICD-10-CM | POA: Diagnosis not present

## 2021-07-01 DIAGNOSIS — R195 Other fecal abnormalities: Secondary | ICD-10-CM | POA: Diagnosis not present

## 2021-07-01 DIAGNOSIS — R918 Other nonspecific abnormal finding of lung field: Secondary | ICD-10-CM | POA: Diagnosis not present

## 2021-07-01 DIAGNOSIS — K760 Fatty (change of) liver, not elsewhere classified: Secondary | ICD-10-CM | POA: Diagnosis not present

## 2021-07-01 DIAGNOSIS — R160 Hepatomegaly, not elsewhere classified: Secondary | ICD-10-CM | POA: Diagnosis not present

## 2021-07-01 DIAGNOSIS — R350 Frequency of micturition: Secondary | ICD-10-CM | POA: Diagnosis not present

## 2021-07-01 DIAGNOSIS — R109 Unspecified abdominal pain: Secondary | ICD-10-CM | POA: Diagnosis not present

## 2021-07-02 ENCOUNTER — Other Ambulatory Visit (HOSPITAL_COMMUNITY): Payer: Self-pay

## 2021-07-02 MED ORDER — HYDROCODONE-ACETAMINOPHEN 5-325 MG PO TABS
ORAL_TABLET | ORAL | 0 refills | Status: DC
  Filled 2021-07-02: qty 8, 2d supply, fill #0

## 2021-07-03 ENCOUNTER — Other Ambulatory Visit (HOSPITAL_COMMUNITY): Payer: Self-pay

## 2021-07-04 ENCOUNTER — Other Ambulatory Visit (HOSPITAL_COMMUNITY): Payer: Self-pay

## 2021-07-08 ENCOUNTER — Ambulatory Visit: Payer: 59 | Admitting: Physician Assistant

## 2021-07-21 ENCOUNTER — Other Ambulatory Visit (HOSPITAL_COMMUNITY): Payer: Self-pay

## 2021-08-01 DIAGNOSIS — R102 Pelvic and perineal pain: Secondary | ICD-10-CM | POA: Diagnosis not present

## 2021-08-01 DIAGNOSIS — N83201 Unspecified ovarian cyst, right side: Secondary | ICD-10-CM | POA: Diagnosis not present

## 2021-08-04 ENCOUNTER — Other Ambulatory Visit (HOSPITAL_COMMUNITY): Payer: Self-pay

## 2021-08-05 DIAGNOSIS — N83201 Unspecified ovarian cyst, right side: Secondary | ICD-10-CM | POA: Diagnosis not present

## 2021-08-11 ENCOUNTER — Telehealth: Payer: Self-pay | Admitting: Emergency Medicine

## 2021-08-11 ENCOUNTER — Other Ambulatory Visit (HOSPITAL_COMMUNITY): Payer: Self-pay

## 2021-08-11 ENCOUNTER — Ambulatory Visit: Payer: 59

## 2021-08-11 DIAGNOSIS — R102 Pelvic and perineal pain: Secondary | ICD-10-CM | POA: Diagnosis not present

## 2021-08-11 MED ORDER — ESTRADIOL 0.1 MG/GM VA CREA
TOPICAL_CREAM | VAGINAL | 2 refills | Status: DC
  Filled 2021-08-11: qty 42.5, 30d supply, fill #0
  Filled 2021-10-15: qty 42.5, 30d supply, fill #1
  Filled 2022-01-20 – 2022-05-13 (×3): qty 42.5, 30d supply, fill #2

## 2021-08-11 MED ORDER — SULFAMETHOXAZOLE-TRIMETHOPRIM 800-160 MG PO TABS
ORAL_TABLET | ORAL | 0 refills | Status: DC
  Filled 2021-08-11: qty 10, 5d supply, fill #0

## 2021-08-21 ENCOUNTER — Other Ambulatory Visit (HOSPITAL_COMMUNITY): Payer: Self-pay

## 2021-08-21 DIAGNOSIS — R300 Dysuria: Secondary | ICD-10-CM | POA: Diagnosis not present

## 2021-08-21 MED ORDER — LOSARTAN POTASSIUM 25 MG PO TABS
ORAL_TABLET | ORAL | 2 refills | Status: DC
  Filled 2021-08-21: qty 90, 90d supply, fill #0
  Filled 2021-10-15 – 2021-11-29 (×2): qty 90, 90d supply, fill #1
  Filled 2022-01-20 – 2022-03-24 (×2): qty 90, 90d supply, fill #2

## 2021-08-21 MED ORDER — LOSARTAN POTASSIUM 50 MG PO TABS
ORAL_TABLET | ORAL | 2 refills | Status: DC
  Filled 2021-08-21 – 2021-10-15 (×2): qty 90, 90d supply, fill #0
  Filled 2022-01-20: qty 90, 90d supply, fill #1
  Filled 2022-05-13: qty 90, 90d supply, fill #2

## 2021-08-22 ENCOUNTER — Other Ambulatory Visit (HOSPITAL_COMMUNITY): Payer: Self-pay

## 2021-09-30 DIAGNOSIS — C7A8 Other malignant neuroendocrine tumors: Secondary | ICD-10-CM | POA: Diagnosis not present

## 2021-09-30 DIAGNOSIS — N83209 Unspecified ovarian cyst, unspecified side: Secondary | ICD-10-CM | POA: Diagnosis not present

## 2021-10-09 ENCOUNTER — Encounter: Payer: 59 | Admitting: Medical-Surgical

## 2021-10-15 ENCOUNTER — Other Ambulatory Visit (HOSPITAL_COMMUNITY): Payer: Self-pay

## 2021-10-22 DIAGNOSIS — Z1231 Encounter for screening mammogram for malignant neoplasm of breast: Secondary | ICD-10-CM

## 2021-10-23 ENCOUNTER — Ambulatory Visit (INDEPENDENT_AMBULATORY_CARE_PROVIDER_SITE_OTHER): Payer: 59

## 2021-10-23 DIAGNOSIS — Z1231 Encounter for screening mammogram for malignant neoplasm of breast: Secondary | ICD-10-CM | POA: Diagnosis not present

## 2021-10-24 ENCOUNTER — Encounter: Payer: Self-pay | Admitting: Medical-Surgical

## 2021-10-24 ENCOUNTER — Other Ambulatory Visit (HOSPITAL_COMMUNITY): Payer: Self-pay

## 2021-10-24 ENCOUNTER — Ambulatory Visit (INDEPENDENT_AMBULATORY_CARE_PROVIDER_SITE_OTHER): Payer: 59 | Admitting: Medical-Surgical

## 2021-10-24 VITALS — BP 128/78 | HR 68 | Resp 20 | Ht 65.0 in | Wt 193.2 lb

## 2021-10-24 DIAGNOSIS — Z0000 Encounter for general adult medical examination without abnormal findings: Secondary | ICD-10-CM | POA: Diagnosis not present

## 2021-10-24 DIAGNOSIS — Z23 Encounter for immunization: Secondary | ICD-10-CM | POA: Diagnosis not present

## 2021-10-24 DIAGNOSIS — Z7689 Persons encountering health services in other specified circumstances: Secondary | ICD-10-CM | POA: Diagnosis not present

## 2021-10-24 DIAGNOSIS — R102 Pelvic and perineal pain: Secondary | ICD-10-CM | POA: Diagnosis not present

## 2021-10-24 MED ORDER — AMITRIPTYLINE HCL 25 MG PO TABS
25.0000 mg | ORAL_TABLET | Freq: Every day | ORAL | 0 refills | Status: DC
Start: 2021-10-24 — End: 2022-01-20
  Filled 2021-10-24: qty 90, 90d supply, fill #0

## 2021-10-24 MED ORDER — VALACYCLOVIR HCL 1 G PO TABS
2000.0000 mg | ORAL_TABLET | Freq: Two times a day (BID) | ORAL | 2 refills | Status: DC | PRN
  Filled 2021-10-24: qty 10, 3d supply, fill #0
  Filled 2021-11-29: qty 10, 3d supply, fill #1
  Filled 2022-01-20: qty 10, 3d supply, fill #2

## 2021-10-24 MED ORDER — TOPIRAMATE 50 MG PO TABS
50.0000 mg | ORAL_TABLET | Freq: Every day | ORAL | 1 refills | Status: DC
  Filled 2021-10-24: qty 90, 90d supply, fill #0
  Filled 2022-01-20: qty 90, 90d supply, fill #1

## 2021-10-24 NOTE — Progress Notes (Signed)
Complete physical exam  Patient: Lisa Glass   DOB: 04-04-1966   56 y.o. Female  MRN: [id number]  Subjective:    Chief Complaint  Patient presents with   Annual Exam    Lisa Glass is a 56 y.o. female who presents today for a complete physical exam. She reports consuming a general diet. The patient does not participate in regular exercise at present. She generally feels fairly well. She reports sleeping well. She does not have additional problems to discuss today.    Most recent fall risk assessment:    10/24/2021    3:13 PM  Strum in the past year? 0  Number falls in past yr: 0  Injury with Fall? 0  Risk for fall due to : No Fall Risks  Follow up Falls evaluation completed     Most recent depression screenings:    10/24/2021    3:13 PM 06/16/2021    3:08 PM  PHQ 2/9 Scores  PHQ - 2 Score 0 0    Vision:Not within last year , Dental: No current dental problems and Receives regular dental care, and STD: The patient denies history of sexually transmitted disease.    Patient Care Team: Samuel Bouche, NP as PCP - General (Nurse Practitioner)   Outpatient Medications Prior to Visit  Medication Sig   estradiol (ESTRACE) 0.1 MG/GM vaginal cream Insert 0.5 g into vagina nightly for 1 week and then use 2-3 times per week as directed.   losartan (COZAAR) 25 MG tablet TAKE 1 TABLET BY MOUTH ONCE DAILY (IN ADDITION TO 50 MG TABLET.)   losartan (COZAAR) 50 MG tablet TAKE 1 TABLET BY MOUTH ONCE DAILY IN ADDITION TO 25 MG   Multiple Vitamin (MULTI-VITAMIN) tablet Take 1 tablet by mouth daily.   Sodium Fluoride 1.1 % PSTE Apply a thin ribbon to toothbrush and brush thoroughly once daily for 2 minutes, preferably at bedtime.   triamcinolone cream (KENALOG) 0.5 % Apply 1 application topically 2 (two) times daily. To affected areas. (Patient taking differently: Apply 1 application  topically as needed. To affected areas.)   triamcinolone cream (KENALOG) 0.5 % Apply  topically.   [DISCONTINUED] topiramate (TOPAMAX) 25 MG tablet Take 1 tablet (25 mg total) by mouth 2 (two) times daily.   [DISCONTINUED] valACYclovir (VALTREX) 1000 MG tablet Take 2 tablets by mouth 2 (two) times daily as needed.   [DISCONTINUED] acetaminophen (TYLENOL) 500 MG tablet Take by mouth.   [DISCONTINUED] HYDROcodone-acetaminophen (NORCO/VICODIN) 5-325 MG tablet Take one tablet by mouth every 6 (six) hours as needed for up to 3 days. Max Daily Amount: 4 tablets   [DISCONTINUED] losartan (COZAAR) 25 MG tablet Take 75 mg by mouth daily.   [DISCONTINUED] losartan (COZAAR) 25 MG tablet TAKE 1 TABLET BY MOUTH ONCE DAILY IN ADDITION TO 50 MG TABLET.   [DISCONTINUED] sulfamethoxazole-trimethoprim (BACTRIM DS) 800-160 MG tablet Take 1 tablet every 12 hours by mouth for 5 days.   No facility-administered medications prior to visit.    Review of Systems  Constitutional:  Negative for chills, fever, malaise/fatigue and weight loss.  HENT:  Negative for congestion, ear pain, hearing loss, sinus pain and sore throat.   Eyes:  Negative for blurred vision, photophobia and pain.  Respiratory:  Negative for cough, shortness of breath and wheezing.   Cardiovascular:  Negative for chest pain, palpitations and leg swelling.  Gastrointestinal:  Negative for abdominal pain, constipation, diarrhea, heartburn, nausea and vomiting.  Genitourinary:  Negative for  dysuria, frequency and urgency.  Musculoskeletal:  Positive for myalgias (pelvic pain). Negative for falls and neck pain.  Skin:  Negative for itching and rash.  Neurological:  Negative for dizziness, weakness and headaches.  Endo/Heme/Allergies:  Negative for polydipsia. Does not bruise/bleed easily.  Psychiatric/Behavioral:  Negative for depression, substance abuse and suicidal ideas. The patient is not nervous/anxious.      Objective:    BP 128/78 (BP Location: Right Arm, Cuff Size: Large)   Pulse 68   Resp 20   Ht '5\' 5"'$  (1.651 m)   Wt 193  lb 3.2 oz (87.6 kg)   SpO2 98%   BMI 32.15 kg/m    Physical Exam Constitutional:      General: She is not in acute distress.    Appearance: Normal appearance. She is obese. She is not ill-appearing.  HENT:     Head: Normocephalic and atraumatic.     Right Ear: Tympanic membrane, ear canal and external ear normal. There is no impacted cerumen.     Left Ear: Tympanic membrane, ear canal and external ear normal. There is no impacted cerumen.     Nose: Nose normal.     Mouth/Throat:     Mouth: Mucous membranes are moist.     Pharynx: No oropharyngeal exudate or posterior oropharyngeal erythema.  Eyes:     Extraocular Movements: Extraocular movements intact.     Conjunctiva/sclera: Conjunctivae normal.     Pupils: Pupils are equal, round, and reactive to light.  Neck:     Thyroid: No thyromegaly.     Vascular: No carotid bruit or JVD.     Trachea: Trachea normal.  Cardiovascular:     Rate and Rhythm: Normal rate and regular rhythm.     Pulses: Normal pulses.     Heart sounds: Normal heart sounds. No murmur heard.    No friction rub. No gallop.  Pulmonary:     Effort: Pulmonary effort is normal. No respiratory distress.     Breath sounds: Normal breath sounds. No wheezing.  Abdominal:     General: Bowel sounds are normal. There is no distension.     Palpations: Abdomen is soft.     Tenderness: There is no abdominal tenderness. There is no guarding.  Musculoskeletal:        General: Normal range of motion.     Cervical back: Normal range of motion and neck supple. No tenderness.  Lymphadenopathy:     Cervical: No cervical adenopathy.  Skin:    General: Skin is warm and dry.  Neurological:     Mental Status: She is alert and oriented to person, place, and time.     Cranial Nerves: No cranial nerve deficit.  Psychiatric:        Mood and Affect: Mood normal.        Behavior: Behavior normal.        Thought Content: Thought content normal.        Judgment: Judgment normal.    No results found for any visits on 10/24/21.     Assessment & Plan:    Routine Health Maintenance and Physical Exam  Immunization History  Administered Date(s) Administered   Influenza, Seasonal, Injecte, Preservative Fre 01/13/2016, 01/14/2017, 01/13/2018   Influenza-Unspecified 01/27/2013, 01/17/2015, 01/13/2016, 01/04/2021   PFIZER(Purple Top)SARS-COV-2 Vaccination 04/01/2019, 04/24/2019, 01/12/2020   Tdap 01/05/2011   Zoster Recombinat (Shingrix) 08/15/2019    Health Maintenance  Topic Date Due   COVID-19 Vaccine (4 - Booster for Concepcion series) 11/09/2021 (Originally 03/08/2020)  TETANUS/TDAP  06/17/2022 (Originally 01/04/2021)   INFLUENZA VACCINE  11/04/2021   PAP SMEAR-Modifier  10/28/2022   MAMMOGRAM  10/24/2023   COLONOSCOPY (Pts 45-101yr Insurance coverage will need to be confirmed)  06/12/2030   HPV VACCINES  Aged Out   Hepatitis C Screening  Discontinued   HIV Screening  Discontinued   Zoster Vaccines- Shingrix  Discontinued    Discussed health benefits of physical activity, and encouraged her to engage in regular exercise appropriate for her age and condition.  1. Annual physical exam Recent CBC/CMP. Checking lipid panel. UTD on preventative care. Wellness info with [facility].  - Lipid panel  2. Need for Tdap vaccination Declined today.   3. Encounter for weight management Restart Topamax '50mg'$  daily. Reviewed recommendations for dietary and lifestyle modification.  4. Pelvic pain Referring to pelvic PT. Start Amitriptyline '25mg'$  nightly.  - Ambulatory referral to Physical Therapy  Return in about 6 months (around 04/26/2022) for weight management follow up.   JSamuel Bouche NP

## 2021-10-27 ENCOUNTER — Other Ambulatory Visit (HOSPITAL_COMMUNITY): Payer: Self-pay

## 2021-11-14 ENCOUNTER — Encounter: Payer: Self-pay | Admitting: Medical-Surgical

## 2021-11-17 DIAGNOSIS — R102 Pelvic and perineal pain: Secondary | ICD-10-CM | POA: Diagnosis not present

## 2021-11-18 DIAGNOSIS — R102 Pelvic and perineal pain: Secondary | ICD-10-CM | POA: Diagnosis not present

## 2021-11-29 ENCOUNTER — Other Ambulatory Visit (HOSPITAL_COMMUNITY): Payer: Self-pay

## 2021-12-24 DIAGNOSIS — Z9089 Acquired absence of other organs: Secondary | ICD-10-CM | POA: Diagnosis not present

## 2021-12-24 DIAGNOSIS — R911 Solitary pulmonary nodule: Secondary | ICD-10-CM | POA: Diagnosis not present

## 2021-12-24 DIAGNOSIS — C7A8 Other malignant neuroendocrine tumors: Secondary | ICD-10-CM | POA: Diagnosis not present

## 2021-12-24 DIAGNOSIS — Z905 Acquired absence of kidney: Secondary | ICD-10-CM | POA: Diagnosis not present

## 2021-12-26 DIAGNOSIS — C7A8 Other malignant neuroendocrine tumors: Secondary | ICD-10-CM | POA: Diagnosis not present

## 2022-01-20 ENCOUNTER — Other Ambulatory Visit: Payer: Self-pay | Admitting: Medical-Surgical

## 2022-01-20 ENCOUNTER — Other Ambulatory Visit (HOSPITAL_COMMUNITY): Payer: Self-pay

## 2022-01-20 MED ORDER — TRIAMCINOLONE ACETONIDE 0.5 % EX CREA
1.0000 | TOPICAL_CREAM | Freq: Two times a day (BID) | CUTANEOUS | 0 refills | Status: DC
  Filled 2022-01-20: qty 15, 30d supply, fill #0

## 2022-01-20 MED ORDER — AMITRIPTYLINE HCL 25 MG PO TABS
25.0000 mg | ORAL_TABLET | Freq: Every day | ORAL | 0 refills | Status: DC
  Filled 2022-01-20: qty 90, 90d supply, fill #0

## 2022-01-30 ENCOUNTER — Other Ambulatory Visit (HOSPITAL_COMMUNITY): Payer: Self-pay

## 2022-03-11 ENCOUNTER — Ambulatory Visit (HOSPITAL_COMMUNITY)
Admission: EM | Admit: 2022-03-11 | Discharge: 2022-03-11 | Disposition: A | Discharge status: Home / Self Care | Payer: 59 | Attending: Physician Assistant | Admitting: Physician Assistant

## 2022-03-11 ENCOUNTER — Encounter (HOSPITAL_COMMUNITY): Payer: Self-pay | Admitting: Physician Assistant

## 2022-03-11 DIAGNOSIS — L089 Local infection of the skin and subcutaneous tissue, unspecified: Secondary | ICD-10-CM

## 2022-03-11 MED ORDER — AMOXICILLIN-POT CLAVULANATE 875-125 MG PO TABS: 1.0000 | ORAL_TABLET | Freq: Two times a day (BID) | ORAL | 0 refills | Status: DC

## 2022-03-11 NOTE — Discharge Instructions (Signed)
I am concerned that you have an infection.  Please start Augmentin twice a day.  Continue with your warm soaks and applying bacitracin.  If you have any increasing swelling, increased pain, fever, nausea, vomiting you need to be seen immediately.

## 2022-03-11 NOTE — ED Triage Notes (Signed)
Pt is here for possible infected finger on right hand causing swelling and pain x2days

## 2022-03-11 NOTE — ED Provider Notes (Signed)
[facility]    CSN: [id number] Arrival date & time: 03/11/22  1933      History   Chief Complaint Chief Complaint  Patient presents with   infected finger    HPI Lisa Glass is a 56 y.o. female.   Patient presents today with a 2 to 3-day history of swelling and pain in her right fourth finger.  Reports that she went to get her nails done when the nail technician clipped her nail bed with cuticle trimmer.  Over the next several days she developed pain and swelling.  Pain is rated 4 on a 0-10 pain scale, described as throbbing, worse with manipulation of the finger, no relieving factors identified.  She is right-handed.  Denies any fever, nausea, vomiting.  She denies history of MRSA or recurrent skin infections.  Denies any recent antibiotics.  She has tried soaking area and pulling back nailbed which has allowed her to express some purulent/serous drainage.  She has been applying bacitracin but does not believe this is effective in managing her symptoms.    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   Primary osteoarthritis of one knee, right 06/25/2020   Abdominal mass 04/03/2020   Premature menopause 08/15/2019   Eye exam, routine 08/15/2019   Diverticulitis 08/15/2019   Candida albicans infection 10/24/2013   Irritant dermatitis 10/24/2013   Pain in joint involving ankle and foot 03/22/2012   Laceration of thumb 08/24/2011   Sebaceous cyst 04/13/2011   Pilar cyst 04/03/2011    Past Surgical History:  Procedure Laterality Date   APPENDECTOMY     04/16/20, excision of right lower quadrant mesenteric mass   ENDOMETRIAL ABLATION     kidney     removal   OTHER  SURGICAL HISTORY     Biopsy R inguinal 05/28/20   URETERAL REIMPLANTION Bilateral     OB History   No obstetric history on file.      Home Medications    Prior to Admission medications   Medication Sig Start Date End Date Taking? Authorizing Provider  amoxicillin-clavulanate (AUGMENTIN) 875-125 MG tablet Take 1 tablet by mouth every 12 (twelve) hours. 03/11/22  Yes Jlon Betker K, PA-C  amitriptyline (ELAVIL) 25 MG tablet Take 1 tablet (25 mg total) by mouth at bedtime. 01/20/22   Samuel Bouche, NP  estradiol (ESTRACE) 0.1 MG/GM vaginal cream Insert 0.5 g into vagina nightly for 1 week and then use 2-3 times per week as directed. 08/11/21     losartan (COZAAR) 25 MG tablet TAKE 1 TABLET BY MOUTH ONCE DAILY (IN ADDITION TO 50 MG TABLET.) 08/21/21     losartan (COZAAR) 50 MG tablet TAKE 1 TABLET BY MOUTH ONCE DAILY IN ADDITION TO 25 MG 08/21/21     Multiple Vitamin (MULTI-VITAMIN) tablet Take 1 tablet by mouth daily.    [provider]  Sodium Fluoride 1.1 % PSTE Apply a thin ribbon to toothbrush and brush thoroughly once daily for 2 minutes, preferably at bedtime. 05/05/21     topiramate (TOPAMAX) 50 MG tablet Take 1 tablet (50 mg total)  by mouth daily. 10/24/21   Samuel Bouche, NP  triamcinolone cream (KENALOG) 0.5 % Apply 1 application topically 2 (two) times daily. To affected areas. Patient taking differently: Apply 1 application  topically as needed. To affected areas. 03/21/20   Emeterio Reeve, DO  triamcinolone cream (KENALOG) 0.5 % Apply 1 Application topically 2 (two) times daily. 01/20/22   Samuel Bouche, NP  valACYclovir (VALTREX) 1000 MG tablet Take 2 tablets (2,000 mg total) by mouth 2 (two) times daily as needed. 10/24/21   Samuel Bouche, NP    Family History Family History  Problem Relation Age of Onset   High blood pressure Mother    High blood pressure Father    High blood pressure Sister        Twin   Kidney disease Sister        solitary kidney   Ovarian cancer  Maternal Aunt    Heart attack Maternal Uncle    Kidney disease Cousin    Kidney disease Cousin    Cirrhosis Maternal Uncle        w/ liver transplant   Colon cancer Neg Hx    Esophageal cancer Neg Hx    Pancreatic cancer Neg Hx    Stomach cancer Neg Hx    Colon polyps Neg Hx     Social History Social History   Tobacco Use   Smoking status: Former    Types: Cigarettes    Quit date: 04/06/1985    Years since quitting: 36.9   Smokeless tobacco: Never  Vaping Use   Vaping Use: Never used  Substance Use Topics   Alcohol use: Not Currently    Alcohol/week: 0.0 standard drinks of alcohol   Drug use: Never     Allergies   Patient has no known allergies.   Review of Systems Review of Systems  Constitutional:  Positive for activity change. Negative for appetite change, fatigue and fever.  Gastrointestinal:  Negative for abdominal pain, diarrhea, nausea and vomiting.  Musculoskeletal:  Positive for myalgias. Negative for arthralgias.  Skin:  Positive for color change and wound.     Physical Exam Triage Vital Signs ED Triage Vitals  Enc Vitals Group     BP 03/11/22 1952 115/84     Pulse Rate 03/11/22 1952 74     Resp 03/11/22 1952 12     Temp 03/11/22 1952 98.1 F (36.7 C)     Temp Source 03/11/22 1952 Oral     SpO2 03/11/22 1952 98 %     Weight --      Height --      Head Circumference --      Peak Flow --      Pain Score 03/11/22 1951 2     Pain Loc --      Pain Edu? --      Excl. in [place]? --    No data found.  Updated Vital Signs BP 115/84 (BP Location: Left Arm)   Pulse 74   Temp 98.1 F (36.7 C) (Oral)   Resp 12   SpO2 98%   Visual Acuity Right Eye Distance:   Left Eye Distance:   Bilateral Distance:    Right Eye Near:   Left Eye Near:    Bilateral Near:     Physical Exam Vitals reviewed.  Constitutional:      General: She is awake. She is not in acute distress.    Appearance: Normal appearance. She is well-developed. She is not  ill-appearing.  Comments: Very pleasant female appears stated age in no acute distress sitting comfortably in exam room  HENT:     Head: Normocephalic and atraumatic.  Cardiovascular:     Rate and Rhythm: Normal rate and regular rhythm.     Heart sounds: Normal heart sounds, S1 normal and S2 normal. No murmur heard.    Comments: Capillary fill within 2 seconds right fourth finger Pulmonary:     Effort: Pulmonary effort is normal.     Breath sounds: Normal breath sounds. No wheezing, rhonchi or rales.  Musculoskeletal:     Right hand: Tenderness present. No bony tenderness. Normal range of motion. There is no disruption of two-point discrimination. Normal capillary refill.  Skin:    Findings: Erythema and wound present.     Comments: Erythema and swelling noted distal right fourth finger with some serous drainage noted at distal nail edge.  No obvious fluctuance or collection of purulence.  Psychiatric:        Behavior: Behavior is cooperative.      UC Treatments / Results  Labs (all labs ordered are listed, but only abnormal results are displayed) Labs Reviewed - No data to display  EKG   Radiology No results found.  Procedures Procedures (including critical care time)  Medications Ordered in UC Medications - No data to display  Initial Impression / Assessment and Plan / UC Course  I have reviewed the triage vital signs and the nursing notes.  Pertinent labs & imaging results that were available during my care of the patient were reviewed by me and considered in my medical decision making (see chart for details).     No collection of purulence or significant fluctuance that would lend itself to I&D in clinic.  Will start systemic therapy with Augmentin twice daily for 7 days.  Recommended that she alternate Tylenol and ibuprofen for pain.  She is to continue with warm soaks multiple times a day and use bacitracin on wound.  Discussed that if she has any worsening or  changing symptoms including increased pain, swelling, numbness, paresthesias, fever, nausea, vomiting she should be seen immediately.  Recommended close follow-up with either our clinic or her primary care if symptoms or not improving quickly.  Strict return precautions given.  Final Clinical Impressions(s) / UC Diagnoses   Final diagnoses:  Finger infection     Discharge Instructions      I am concerned that you have an infection.  Please start Augmentin twice a day.  Continue with your warm soaks and applying bacitracin.  If you have any increasing swelling, increased pain, fever, nausea, vomiting you need to be seen immediately.     ED Prescriptions     Medication Sig Dispense Auth. Provider   amoxicillin-clavulanate (AUGMENTIN) 875-125 MG tablet Take 1 tablet by mouth every 12 (twelve) hours. 14 tablet Reem Fleury, Derry Skill, PA-C      PDMP not reviewed this encounter.   Terrilee Croak, PA-C 03/11/22 2009

## 2022-03-24 ENCOUNTER — Other Ambulatory Visit (HOSPITAL_COMMUNITY): Payer: Self-pay

## 2022-03-24 ENCOUNTER — Other Ambulatory Visit: Payer: Self-pay | Admitting: Medical-Surgical

## 2022-03-24 ENCOUNTER — Other Ambulatory Visit: Payer: Self-pay

## 2022-03-24 NOTE — Telephone Encounter (Signed)
Medication never prescribed by our office.  Needs appointment to discuss. She also has a provider that is helping with pelvic pain and such.  Not sure if they are the ones who have been managing this or not.

## 2022-03-24 NOTE — Telephone Encounter (Signed)
Rx written by external provider.

## 2022-03-26 ENCOUNTER — Other Ambulatory Visit (HOSPITAL_COMMUNITY): Payer: Self-pay

## 2022-03-27 DIAGNOSIS — R195 Other fecal abnormalities: Secondary | ICD-10-CM | POA: Diagnosis not present

## 2022-03-27 DIAGNOSIS — R102 Pelvic and perineal pain: Secondary | ICD-10-CM | POA: Diagnosis not present

## 2022-03-27 DIAGNOSIS — C7A8 Other malignant neuroendocrine tumors: Secondary | ICD-10-CM | POA: Diagnosis not present

## 2022-03-27 DIAGNOSIS — Z905 Acquired absence of kidney: Secondary | ICD-10-CM | POA: Diagnosis not present

## 2022-03-27 DIAGNOSIS — Z026 Encounter for examination for insurance purposes: Secondary | ICD-10-CM | POA: Diagnosis not present

## 2022-03-27 DIAGNOSIS — N1370 Vesicoureteral-reflux, unspecified: Secondary | ICD-10-CM | POA: Diagnosis not present

## 2022-03-27 DIAGNOSIS — R7401 Elevation of levels of liver transaminase levels: Secondary | ICD-10-CM | POA: Diagnosis not present

## 2022-03-27 DIAGNOSIS — N83209 Unspecified ovarian cyst, unspecified side: Secondary | ICD-10-CM | POA: Diagnosis not present

## 2022-04-14 ENCOUNTER — Other Ambulatory Visit: Payer: Self-pay

## 2022-04-14 ENCOUNTER — Ambulatory Visit
Admission: RE | Admit: 2022-04-14 | Discharge: 2022-04-14 | Disposition: A | Discharge status: Home / Self Care | Payer: Self-pay | Source: Ambulatory Visit | Attending: Hematology | Admitting: Hematology

## 2022-04-14 DIAGNOSIS — C7A00 Malignant carcinoid tumor of unspecified site: Secondary | ICD-10-CM

## 2022-04-14 NOTE — Progress Notes (Signed)
Request to have 12/26/2021 CT scan images and 07/01/2021 PET scan images pushced ot power share faxed to [facility]

## 2022-04-27 ENCOUNTER — Ambulatory Visit: Payer: 59 | Admitting: Medical-Surgical

## 2022-05-13 ENCOUNTER — Encounter: Payer: Self-pay | Admitting: Medical-Surgical

## 2022-05-13 ENCOUNTER — Ambulatory Visit: Payer: 59 | Admitting: Medical-Surgical

## 2022-05-13 ENCOUNTER — Other Ambulatory Visit (HOSPITAL_COMMUNITY): Payer: Self-pay

## 2022-05-13 ENCOUNTER — Other Ambulatory Visit: Payer: Self-pay

## 2022-05-13 VITALS — BP 125/81 | HR 61 | Resp 20 | Ht 65.0 in | Wt 195.7 lb

## 2022-05-13 DIAGNOSIS — E28319 Asymptomatic premature menopause: Secondary | ICD-10-CM | POA: Diagnosis not present

## 2022-05-13 DIAGNOSIS — Z7689 Persons encountering health services in other specified circumstances: Secondary | ICD-10-CM | POA: Diagnosis not present

## 2022-05-13 DIAGNOSIS — R102 Pelvic and perineal pain: Secondary | ICD-10-CM

## 2022-05-13 DIAGNOSIS — Z8619 Personal history of other infectious and parasitic diseases: Secondary | ICD-10-CM

## 2022-05-13 MED ORDER — ESTRADIOL 0.1 MG/GM VA CREA
0.5000 g | TOPICAL_CREAM | Freq: Every evening | VAGINAL | 2 refills | Status: DC
  Filled 2022-05-13: qty 42.5, 90d supply, fill #0

## 2022-05-13 MED ORDER — TIRZEPATIDE 2.5 MG/0.5ML ~~LOC~~ SOAJ
2.5000 mg | SUBCUTANEOUS | 0 refills | Status: DC
  Filled 2022-05-13: qty 2, 28d supply, fill #0

## 2022-05-13 MED ORDER — VALACYCLOVIR HCL 1 G PO TABS
2000.0000 mg | ORAL_TABLET | Freq: Two times a day (BID) | ORAL | 2 refills | Status: DC | PRN
  Filled 2022-05-13: qty 10, 3d supply, fill #0
  Filled 2022-09-23: qty 10, 3d supply, fill #1
  Filled 2023-03-20: qty 10, 3d supply, fill #2

## 2022-05-13 NOTE — Progress Notes (Signed)
Established Patient Office Visit  Subjective   Patient ID: NATASJA NIDAY, female   DOB: Sep 19, 1965 Age: 57 y.o. MRN: [id number]   Chief Complaint  Patient presents with   Follow-up   HPI Pleasant 57 year old female presenting today for the following:   Weight management: tried taking Topamax for weight loss but had to stop it because of cognitive slowing. Reports that she is trying to work on her diet and exercise habits but is obviously not doing great with it. Still interested in options help with weight loss.   Pelvic pain: tried Amitriptyline but this also caused some difficulty thinking and finding words that left her unable to do her job effectively. Stopped the medication and is not taking anything at this point. Went to one pelvic PT appointment but did not return for follow up due to feeling uncomfortable with the process. Despite this, she notes that her pelvic pain is about 90% improved. Feels that her weight may be a factor since most of her pain is now in the low back.  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 Requesting a refill.   Using Estrace cream vaginally for premature menopause and to treat atrophic changes. Feels the medication is working well and is requesting a refill.    Objective:    Vitals:   05/13/22 1048  BP: 125/81  Pulse: 61  Resp: 20  Height: '5\' 5"'$  (1.651 m)  Weight: 195 lb 11.2 oz (88.8 kg)  SpO2: 99%  BMI (Calculated): 32.57    Physical Exam Vitals and nursing note reviewed.  Constitutional:      General: She is not in acute distress.    Appearance: Normal appearance. She is obese. She is not ill-appearing.  HENT:     Head: Normocephalic and atraumatic.  Cardiovascular:     Rate and Rhythm: Normal rate and regular rhythm.     Pulses: Normal pulses.     Heart sounds: Normal heart sounds.  Pulmonary:     Effort: Pulmonary effort is normal. No respiratory distress.     Breath sounds: Normal breath sounds. No wheezing, rhonchi  or rales.  Skin:    General: Skin is warm and dry.  Neurological:     Mental Status: She is alert and oriented to person, place, and time.  Psychiatric:        Mood and Affect: Mood normal.        Behavior: Behavior normal.        Thought Content: Thought content normal.        Judgment: Judgment normal.   No results found for this or any previous visit (from the past 24 hour(s)).     The 10-year ASCVD risk score (Arnett DK, et al., 2019) is: 2.1%   Values used to calculate the score:     Age: 63 years     Sex: Female     Is Non-Hispanic African American: No     Diabetic: No     Tobacco smoker: No     Systolic Blood Pressure: 124 mmHg     Is BP treated: Yes     HDL Cholesterol: 75 mg/dL     Total Cholesterol: 238 mg/dL   Assessment & Plan:   1. Encounter for weight management Discussed various medication options for weight loss. Insurance will determine what options are affordable. Feel she would benefit from [facility] or Tirzepatide since these are long term options but she has a history of neuroendocrine tumor and is  under the care of oncology. Advised her to discuss the appropriateness of one of these medications with oncology and if they clear it, we can see if it will be covered. She will let me know what they say via MyChart. In the meantime, agreeable to send the starting dose to the pharmacy to see if we can obtain authorization for it. If not, consider short term treatment with phentermine.   2. Premature menopause Continue Estrace cream. Refill sent.   3. H/O cold sores Continue prn Valtrex. Refill sent.   4. Pelvic pain Much improved so no medications added today. Agree that weight may be a factor in the low back pain she is reporting. Plan for continued weight loss efforts. Treat low back pain with conservative measures. Consider formal PT referral if desired.   Return if symptoms worsen or fail to improve.  ___________________________________________ Clearnce Sorrel, DNP, APRN, FNP-BC [facility] [place]

## 2022-05-14 ENCOUNTER — Other Ambulatory Visit: Payer: Self-pay

## 2022-05-14 ENCOUNTER — Encounter: Payer: Self-pay | Admitting: Medical-Surgical

## 2022-05-14 MED ORDER — PHENTERMINE HCL 37.5 MG PO TABS
18.7500 mg | ORAL_TABLET | Freq: Every morning | ORAL | 0 refills | Status: DC
  Filled 2022-05-14: qty 15, 30d supply, fill #0

## 2022-05-15 ENCOUNTER — Other Ambulatory Visit: Payer: Self-pay

## 2022-05-15 ENCOUNTER — Other Ambulatory Visit (HOSPITAL_COMMUNITY): Payer: Self-pay

## 2022-06-15 ENCOUNTER — Other Ambulatory Visit (HOSPITAL_COMMUNITY): Payer: Self-pay

## 2022-06-15 ENCOUNTER — Other Ambulatory Visit: Payer: Self-pay

## 2022-06-15 ENCOUNTER — Encounter: Payer: Self-pay | Admitting: Medical-Surgical

## 2022-06-15 ENCOUNTER — Ambulatory Visit: Payer: 59 | Admitting: Medical-Surgical

## 2022-06-15 VITALS — BP 126/83 | HR 80 | Resp 20 | Ht 65.0 in | Wt 194.1 lb

## 2022-06-15 DIAGNOSIS — Z713 Dietary counseling and surveillance: Secondary | ICD-10-CM

## 2022-06-15 DIAGNOSIS — Z7689 Persons encountering health services in other specified circumstances: Secondary | ICD-10-CM

## 2022-06-15 MED ORDER — PHENTERMINE HCL 37.5 MG PO TABS
37.5000 mg | ORAL_TABLET | Freq: Every morning | ORAL | 1 refills | Status: DC
  Filled 2022-06-15: qty 30, 30d supply, fill #0
  Filled 2022-07-09: qty 30, 30d supply, fill #1

## 2022-06-15 NOTE — Progress Notes (Signed)
   Established Patient Office Visit  Subjective   Patient ID: Lisa Glass, female   DOB: Mar 08, 1966 Age: 57 y.o. MRN: [id number]   Chief Complaint  Patient presents with   Follow-up    MEDICATION    HPI Pleasant 57 year old female presenting today for weight check on phentermine.  She is completed 1 month of half dose phentermine 37.5 mg tablets.  She has been taking medication daily, tolerating well without side effects.  Was prepared to have palpitations since the medication is a stimulant.  Has noticed that her appetite is slightly reduced.  Making efforts to make healthier options, increase protein, and eat smaller portions.  Has increased her physical activity and is walking more often.  Denies chest pain, shortness of breath, constipation.   Objective:    Vitals:   06/15/22 1336  BP: 126/83  Pulse: 80  Resp: 20  Height: 5\' 5"  (1.651 m)  Weight: 194 lb 1.6 oz (88 kg)  SpO2: 98%  BMI (Calculated): 32.3    Physical Exam Vitals reviewed.  Constitutional:      General: She is not in acute distress.    Appearance: Normal appearance. She is not ill-appearing.  HENT:     Head: Normocephalic and atraumatic.  Cardiovascular:     Rate and Rhythm: Normal rate and regular rhythm.     Pulses: Normal pulses.     Heart sounds: Normal heart sounds.  Pulmonary:     Effort: Pulmonary effort is normal. No respiratory distress.     Breath sounds: Normal breath sounds. No wheezing, rhonchi or rales.  Skin:    General: Skin is warm and dry.  Neurological:     Mental Status: She is alert and oriented to person, place, and time.  Psychiatric:        Mood and Affect: Mood normal.        Behavior: Behavior normal.        Thought Content: Thought content normal.        Judgment: Judgment normal.   No results found for this or any previous visit (from the past 24 hour(s)).     The 10-year ASCVD risk score (Arnett DK, et al., 2019) is: 2.1%   Values used to calculate the score:      Age: 25 years     Sex: Female     Is Non-Hispanic African American: No     Diabetic: No     Tobacco smoker: No     Systolic Blood Pressure: 277 mmHg     Is BP treated: Yes     HDL Cholesterol: 75 mg/dL     Total Cholesterol: 238 mg/dL   Assessment & Plan:   1. Encounter for weight management Has done well so far with tolerating the medication however has only lost about 1.5 pounds.  Increasing phentermine to 37.5 mg.  Recommend continuing to increase protein and physical activity.  Add in strength training as tolerated.  Continue to aim for smaller portions and healthier choices.  Return in about 6 weeks (around 07/27/2022) for weight check.  ___________________________________________ Clearnce Sorrel, DNP, APRN, FNP-BC [facility] [place]

## 2022-06-16 ENCOUNTER — Other Ambulatory Visit (HOSPITAL_COMMUNITY): Payer: Self-pay

## 2022-06-16 MED ORDER — LOSARTAN POTASSIUM 25 MG PO TABS
25.0000 mg | ORAL_TABLET | Freq: Every day | ORAL | 2 refills | Status: DC
  Filled 2022-06-16: qty 90, 90d supply, fill #0
  Filled 2022-09-23: qty 90, 90d supply, fill #1
  Filled 2022-12-21: qty 90, 90d supply, fill #2

## 2022-06-25 ENCOUNTER — Inpatient Hospital Stay: Payer: 59 | Attending: Hematology | Admitting: Hematology

## 2022-06-25 ENCOUNTER — Inpatient Hospital Stay: Payer: 59

## 2022-06-25 ENCOUNTER — Encounter: Payer: Self-pay | Admitting: Hematology

## 2022-06-25 VITALS — BP 139/91 | HR 78 | Temp 98.0°F | Resp 18 | Ht 65.0 in | Wt 192.5 lb

## 2022-06-25 DIAGNOSIS — Z79899 Other long term (current) drug therapy: Secondary | ICD-10-CM | POA: Insufficient documentation

## 2022-06-25 DIAGNOSIS — Z8249 Family history of ischemic heart disease and other diseases of the circulatory system: Secondary | ICD-10-CM | POA: Insufficient documentation

## 2022-06-25 DIAGNOSIS — Z8041 Family history of malignant neoplasm of ovary: Secondary | ICD-10-CM | POA: Insufficient documentation

## 2022-06-25 DIAGNOSIS — Z8379 Family history of other diseases of the digestive system: Secondary | ICD-10-CM | POA: Diagnosis not present

## 2022-06-25 DIAGNOSIS — D3A8 Other benign neuroendocrine tumors: Secondary | ICD-10-CM

## 2022-06-25 DIAGNOSIS — Z841 Family history of disorders of kidney and ureter: Secondary | ICD-10-CM | POA: Insufficient documentation

## 2022-06-25 DIAGNOSIS — I10 Essential (primary) hypertension: Secondary | ICD-10-CM | POA: Diagnosis not present

## 2022-06-25 DIAGNOSIS — C7A8 Other malignant neuroendocrine tumors: Secondary | ICD-10-CM | POA: Insufficient documentation

## 2022-06-25 DIAGNOSIS — Z9049 Acquired absence of other specified parts of digestive tract: Secondary | ICD-10-CM | POA: Diagnosis not present

## 2022-06-25 LAB — CMP (CANCER CENTER ONLY)
ALT: 13 U/L (ref 0–44)
AST: 23 U/L (ref 15–41)
Albumin: 4.6 g/dL (ref 3.5–5.0)
Alkaline Phosphatase: 77 U/L (ref 38–126)
Anion gap: 7 (ref 5–15)
BUN: 21 mg/dL — ABNORMAL HIGH (ref 6–20)
CO2: 25 mmol/L (ref 22–32)
Calcium: 9.2 mg/dL (ref 8.9–10.3)
Chloride: 104 mmol/L (ref 98–111)
Creatinine: 0.93 mg/dL (ref 0.44–1.00)
GFR, Estimated: >60 mL/min (ref >60)
Glucose, Bld: 102 mg/dL — ABNORMAL HIGH (ref 70–99)
Potassium: 3.8 mmol/L (ref 3.5–5.1)
Sodium: 136 mmol/L (ref 135–145)
Total Bilirubin: 0.5 mg/dL (ref 0.3–1.2)
Total Protein: 7.5 g/dL (ref 6.5–8.1)

## 2022-06-25 LAB — CBC WITH DIFFERENTIAL/PLATELET
Abs Immature Granulocytes: 0.02 10*3/uL (ref 0.00–0.07)
Basophils Absolute: 0 10*3/uL (ref 0.0–0.1)
Basophils Relative: 1 %
Eosinophils Absolute: 0.1 10*3/uL (ref 0.0–0.5)
Eosinophils Relative: 2 %
HCT: 39.7 % (ref 36.0–46.0)
Hemoglobin: 13.8 g/dL (ref 12.0–15.0)
Immature Granulocytes: 0 %
Lymphocytes Relative: 38 %
Lymphs Abs: 2.2 10*3/uL (ref 0.7–4.0)
MCH: 32.4 pg (ref 26.0–34.0)
MCHC: 34.8 g/dL (ref 30.0–36.0)
MCV: 93.2 fL (ref 80.0–100.0)
Monocytes Absolute: 0.4 10*3/uL (ref 0.1–1.0)
Monocytes Relative: 6 %
Neutro Abs: 3.1 10*3/uL (ref 1.7–7.7)
Neutrophils Relative %: 53 %
Platelets: 195 10*3/uL (ref 150–400)
RBC: 4.26 MIL/uL (ref 3.87–5.11)
RDW: 12.6 % (ref 11.5–15.5)
WBC: 5.7 10*3/uL (ref 4.0–10.5)
nRBC: 0 % (ref 0.0–0.2)

## 2022-06-25 NOTE — Assessment & Plan Note (Signed)
Stage: II vs IV, cT2cN0pMx, mitotic index low at 1 mitosis/10 hpfs and ki 67 index 1-2%  -mesentery mass was initially found on CT in 2016, due to enlarging mass on CT, she underwent laparoscopic appendectomy with excision of mesenteric mass on 04/16/2020 which showed well differentiated neuroendocrine tumor (carcinoid tumor), foci suspicious for LVI, background appendix without diagnostic abnormality. Comment: tumor located in the mesentery and approaches the soft tissue margin without definitive involvement, mitotic index low at 1 mitosis/10 hpfs and ki 67 index 1-2%. Entire appendix is negative for tumor. -05/08/2020 NETSPOT PET: Two areas of hypermetabolic activity over the right lower lobe but no abnormality in the CT in either of these areas, scattered nonspecific areas of activity over bowel, single hypermetabolic right inguinal node. -05/28/2020 US guided biopsy of right inguinal LN: negative -Patient underwent robotic ileocolectomy on 10/02/2020 and pathology showed unremarkable segment of terminal ileum and colon with a surgically absent appendix, no residual neoplasm or malignancy identified, 0/19 benign lymph nodes  -she has been on surveillance since then, no evidence of disease on last CT scan on 12/24/2021

## 2022-06-25 NOTE — Progress Notes (Signed)
[place]   [phone] Fax:([phone] [place] Note   Patient Care Team: Samuel Bouche, NP as PCP - General (Nurse Practitioner)  Date of Service:  06/25/2022   CHIEF COMPLAINTS/PURPOSE OF CONSULTATION:  neuroendocrine cancer  REFERRING PHYSICIAN:  McGuirt, Molli Barrows D,PA  ASSESSMENT & PLAN:  Lisa Glass is a 57 y.o.  female with a history of   1.  Well-differentiated neuroendocrine tumor of unknown primary -I have reviewed her outside medical record extensively, and confirmed the key findings with patient. -She presented with a enlarging mesenteric mass, initially was found on CT urogram scan in 2016.  Repeated CT scan in December 2021 showed enlarging mesenteric mass 3.5 x 3.3 cm, concerning for malignancy.  She underwent laparoscopic appendectomy with excision of the mesenteric mass, which showed well-differentiated neuroendocrine tumor with clear margins, mitotic rate rate 1/10hpfs and Ki-67 1 to 2%.  Appendix was removed during the surgery which was negative. -Netspot PET scan in February 2022 showed 2 areas of hypermetabolic activity over the right lower lobe without CT corresponding, and single hypermetabolic right inguinal node.  She subsequently underwent right inguinal lymph node biopsy which was negative.  EGD and colonoscopy was also negative for malignancy.  Decision was made to proceed with right ileocolectomy in June 2022, which was negative for primary tumor. -She has been under surveillance since the second surgery, last CT scan in September 2023 showed no evidence of recurrence. -He is clinically doing very well, asymptomatic, exam was unremarkable. -The mesenteric mass is likely a metastatic lymph node, is the primary site of her neuroendocrine tumor is still unclear, but likely in small bowel. -I recommend continued close surveillance.  We discussed the intermittent nature of this disease, and no need adjuvant therapy at this  point. -Will repeat labs today, and obtain surveillance CT scan in the next months. -Will continue follow-up every 4 months for 1-2 more years, then every 6 months.  Plan to repeat CT scan annually after the next 1. -We briefly discussed treatment options, such as Sandostatin injection, Afinitor, Lutathera if she has recurrent disease. -Due to her family history of ovarian cancer, I discussed genetic testing.  She has had with her GYN, probably no need to repeat in the near future.    PLAN:  -CT CAP w Contrast scan in 2 wk - 24 HR urine for 5-HIAA. To get a baseline - lab today -lab,fu in 4 months   HISTORY OF PRESENTING ILLNESS:  Lisa Glass 57 y.o. female is a here because of neuroendocrine cancer. The patient was referred by Retta Mac D,PA, at [facility] oncology. The patient presents to the clinic today alone.  She is a Marine scientist at [employer].  In 2016 Pt felt like she had  a kidney infection and underwent a CT urogram, which incidentally showed a small iliac chronic mesenteric soft tissue mass.  This was monitored on subsequent CT scans, and the mesentery mass slowly increased in size.  He was referred to general surgery, and underwent laparoscopic appendectomy with excision of the mesenteric mass on April 16, 2020.  The surgical pathology reviewed well-differentiated neuroendocrine tumor.  She subsequently underwent right ilia-hemicolectomy which was negative for primary tumor.  She has been observed since then.  Pt state that she feels good. She had healed from surgery very well. Overall she is doing well.  She has a PMHx of.... Allergies Depression Anemia Hypertension Kidney Disease Appendectomy   Socially... Married  3  children    REVIEW OF SYSTEMS:    Constitutional: Denies fevers, chills or abnormal night sweats Eyes: Denies blurriness of vision, double vision or watery eyes Ears, nose, mouth, throat, and face: Denies mucositis or sore  throat Respiratory: Denies cough, dyspnea or wheezes Cardiovascular: Denies palpitation, chest discomfort or lower extremity swelling Gastrointestinal:  Denies nausea, heartburn or change in bowel habits Skin: Denies abnormal skin rashes Lymphatics: Denies new lymphadenopathy or easy bruising Neurological:Denies numbness, tingling or new weaknesses Behavioral/Psych: Mood is stable, no new changes  All other systems were reviewed with the patient and are negative.   MEDICAL HISTORY:  Past Medical History:  Diagnosis Date   Allergy    Anemia    Back pain    Cancer (HCC)    neuroendocrine Grade I   Depression    Diverticulosis    Fatigue    High cholesterol    Hypertension    Renal disorder     SURGICAL HISTORY: Past Surgical History:  Procedure Laterality Date   APPENDECTOMY     04/16/20, excision of right lower quadrant mesenteric mass   ENDOMETRIAL ABLATION     kidney     removal   OTHER SURGICAL HISTORY     Biopsy R inguinal 05/28/20   URETERAL REIMPLANTION Bilateral     SOCIAL HISTORY: Social History   Socioeconomic History   Marital status: Married    Spouse name: Not on file   Number of children: 3   Years of education: Not on file   Highest education level: Not on file  Occupational History   Occupation: Facilities manager: [employer]  Tobacco Use   Smoking status: Never   Smokeless tobacco: Never  Vaping Use   Vaping Use: Never used  Substance and Sexual Activity   Alcohol use: Not Currently    Comment: social drinker   Drug use: Never   Sexual activity: Yes    Partners: Male  Other Topics Concern   Not on file  Social History Narrative   Not on file   Social Determinants of Health   Financial Resource Strain: Not on file  Food Insecurity: Not on file  Transportation Needs: Not on file  Physical Activity: Not on file  Stress: Not on file  Social Connections: Not on file  Intimate Partner Violence: Not on file    FAMILY HISTORY: Family  History  Problem Relation Age of Onset   High blood pressure Mother    High blood pressure Father    High blood pressure Sister        Twin   Kidney disease Sister        solitary kidney   Ovarian cancer Maternal Aunt    Heart attack Maternal Uncle    Kidney disease Cousin    Kidney disease Cousin    Cirrhosis Maternal Uncle        w/ liver transplant   Colon cancer Neg Hx    Esophageal cancer Neg Hx    Pancreatic cancer Neg Hx    Stomach cancer Neg Hx    Colon polyps Neg Hx     ALLERGIES:  has No Known Allergies.  MEDICATIONS:  Current Outpatient Medications  Medication Sig Dispense Refill   estradiol (ESTRACE) 0.1 MG/GM vaginal cream Place 0.5 g vaginally every evening for 1 week then use 2-3 times weekly as directed. 42 g 2   losartan (COZAAR) 25 MG tablet Take 1 tablet (25 mg total) by mouth daily along  with a 50mg  tablet for a total daily dose of 75mg . 90 tablet 2   losartan (COZAAR) 50 MG tablet TAKE 1 TABLET BY MOUTH ONCE DAILY IN ADDITION TO 25 MG 90 tablet 2   Multiple Vitamin (MULTI-VITAMIN) tablet Take 1 tablet by mouth daily.     phentermine (ADIPEX-P) 37.5 MG tablet Take 1 tablet (37.5 mg total) by mouth every morning. 30 tablet 1   Sodium Fluoride 1.1 % PSTE Apply a thin ribbon to toothbrush and brush thoroughly once daily for 2 minutes, preferably at bedtime. 100 mL 6   triamcinolone cream (KENALOG) 0.5 % Apply 1 application topically 2 (two) times daily. To affected areas. (Patient taking differently: Apply 1 application  topically as needed. To affected areas.) 30 g 1   valACYclovir (VALTREX) 1000 MG tablet Take 2 tablets (2,000 mg total) by mouth 2 (two) times daily as needed. 10 tablet 2   No current facility-administered medications for this visit.    PHYSICAL EXAMINATION: ECOG PERFORMANCE STATUS: 0 - Asymptomatic  Vitals:   06/25/22 1448  BP: (!) 139/91  Pulse: 78  Resp: 18  Temp: 98 F (36.7 C)  SpO2: 100%   Filed Weights   06/25/22 1448   Weight: 192 lb 8 oz (87.3 kg)     NECK: (-)supple, thyroid normal size, non-tender, without nodularity LYMPH: (-) no palpable lymphadenopathy in the cervical, axillary  LUNGS: (-)clear to auscultation and percussion with normal breathing effort HEART:(-) regular rate & rhythm and no murmurs and no lower extremity edema ABDOMEN:(-)abdomen soft, (-)non-tender and normal bowel sounds   LABORATORY DATA:  I have reviewed the data as listed    Latest Ref Rng & Units 03/30/2020    2:32 PM 03/21/2020   12:00 AM 03/07/2020   12:00 AM  CBC  WBC 4.0 - 10.5 K/uL 6.8  5.5  7.1   Hemoglobin 12.0 - 15.0 g/dL 14.1  14.0  14.0   Hematocrit 36.0 - 46.0 % 43.0  40.5  41.7   Platelets 150 - 400 K/uL 228  221  229        Latest Ref Rng & Units 03/30/2020    2:32 PM 03/21/2020   12:00 AM 03/07/2020   12:00 AM  CMP  Glucose 70 - 99 mg/dL 98  97  86   BUN 6 - 20 mg/dL 12  19  17    Creatinine 0.44 - 1.00 mg/dL 1.14  0.91  0.93   Sodium 135 - 145 mmol/L 139  137  139   Potassium 3.5 - 5.1 mmol/L 4.0  4.4  3.9   Chloride 98 - 111 mmol/L 103  103  103   CO2 22 - 32 mmol/L 25  27  24    Calcium 8.9 - 10.3 mg/dL 9.6  9.5  9.6   Total Protein 6.5 - 8.1 g/dL 7.4  6.7  7.6   Total Bilirubin 0.3 - 1.2 mg/dL 0.5  0.7  0.4   Alkaline Phos 38 - 126 U/L 72     AST 15 - 41 U/L 23  22  23    ALT 0 - 44 U/L 15  12  12       RADIOGRAPHIC STUDIES: I have personally reviewed the radiological images as listed and agreed with the findings in the report. No results found.   No orders of the defined types were placed in this encounter.   All questions were answered. The patient knows to call the clinic with any problems, questions or concerns. The  total time spent in the appointment was 45 minutes.     Truitt Merle, MD 06/25/2022 3:23 PM  I, Audry Riles am acting as scribe for Truitt Merle, MD.   I have reviewed the above documentation for accuracy and completeness, and I agree with the above.

## 2022-06-26 NOTE — Progress Notes (Signed)
Consult note faxed to referring provider, Mary Sella, PA-C and [facility] fax number [phone].

## 2022-06-29 ENCOUNTER — Other Ambulatory Visit (HOSPITAL_COMMUNITY): Payer: Self-pay

## 2022-06-29 ENCOUNTER — Ambulatory Visit: Payer: 59 | Admitting: Hematology

## 2022-06-29 LAB — CHROMOGRANIN A: Chromogranin A (ng/mL): 46 ng/mL (ref 0.0–101.8)

## 2022-06-30 ENCOUNTER — Telehealth: Payer: Self-pay

## 2022-06-30 NOTE — Telephone Encounter (Addendum)
Called patient and relayed message below. Patient voiced full understanding.   ----- Message from Truitt Merle, MD sent at 06/27/2022  5:52 PM EDT ----- Please let her know the lab results, all good, no concerns, and encourage her to use MyChart.   YF

## 2022-07-08 ENCOUNTER — Ambulatory Visit (INDEPENDENT_AMBULATORY_CARE_PROVIDER_SITE_OTHER): Payer: 59

## 2022-07-08 DIAGNOSIS — D3A8 Other benign neuroendocrine tumors: Secondary | ICD-10-CM | POA: Diagnosis not present

## 2022-07-08 DIAGNOSIS — C7A1 Malignant poorly differentiated neuroendocrine tumors: Secondary | ICD-10-CM | POA: Diagnosis not present

## 2022-07-08 MED ORDER — IOHEXOL 300 MG/ML  SOLN
100.0000 mL | Freq: Once | INTRAMUSCULAR | Status: AC | PRN
  Administered 2022-07-08: 100 mL via INTRAVENOUS

## 2022-07-10 ENCOUNTER — Other Ambulatory Visit: Payer: Self-pay

## 2022-07-10 ENCOUNTER — Telehealth: Payer: Self-pay

## 2022-07-10 NOTE — Telephone Encounter (Addendum)
Called patient and relayed message below as per Dr. Mosetta Putt. Patient voiced full understanding   ----- Message from Malachy Mood, MD sent at 07/09/2022  1:50 PM EDT ----- Please let pt know her CT result, no concerns, let me know if she has questions.   Malachy Mood  07/09/2022

## 2022-07-29 ENCOUNTER — Encounter: Payer: Self-pay | Admitting: Medical-Surgical

## 2022-07-29 ENCOUNTER — Ambulatory Visit: Payer: 59 | Admitting: Medical-Surgical

## 2022-07-29 ENCOUNTER — Other Ambulatory Visit (HOSPITAL_COMMUNITY): Payer: Self-pay

## 2022-07-29 VITALS — BP 128/84 | HR 77 | Resp 20 | Ht 65.0 in | Wt 187.4 lb

## 2022-07-29 DIAGNOSIS — Z713 Dietary counseling and surveillance: Secondary | ICD-10-CM

## 2022-07-29 DIAGNOSIS — Z7689 Persons encountering health services in other specified circumstances: Secondary | ICD-10-CM

## 2022-07-29 MED ORDER — PHENTERMINE HCL 37.5 MG PO TABS
ORAL_TABLET | ORAL | 0 refills | Status: DC
  Filled 2022-07-29 – 2022-08-10 (×2): qty 43, 72d supply, fill #0

## 2022-07-29 NOTE — Progress Notes (Signed)
        Established patient visit  History, exam, impression, and plan:  1. Encounter for weight management 57 year old female presenting today for follow-up on weight management.  She has completed 1 month of half dose phentermine and 2 months of phentermine 30 mg.  Tolerating the medication well without side effects.  Has increased her regular intentional activity and is now walking on lunch breaks as well as walking her dog immediately.  Has increased her protein intake and notes this has been extremely helpful.  No concerns with constipation, palpitations, sleep alteration.  Continues to see benefits to her appetite and feels that the medication helps give her more energy.  Over the last 4 weeks, has lost approximately 5 pounds.  Would like to continue the medication at this point.  Refilling phentermine for 1 more month of 37.5 mg daily followed by a slow taper over several weeks.  At that point, advised her to monitor her symptoms and if she would like to discuss further options for weight management in the long-term setting, she should return to discuss further.  Continue regular intentional exercise and dietary modifications. - phentermine (ADIPEX-P) 37.5 MG tablet; Take 1 tablet (37.5 mg total) by mouth every morning for 30 days, THEN 0.5 tablets (18.75 mg total) in the morning for 14 days, THEN 0.5 tablets (18.75 mg total) every other day for 14 days, THEN 0.5 tablets (18.75 mg total) 2 (two) times a week for 14 days.  Dispense: 43 tablet; Refill: 0  Procedures performed this visit: None.  Return if symptoms worsen or fail to improve.  __________________________________ Thayer Ohm, DNP, APRN, FNP-BC Primary Care and [facility] [place]

## 2022-07-30 ENCOUNTER — Other Ambulatory Visit (HOSPITAL_COMMUNITY): Payer: Self-pay

## 2022-08-03 ENCOUNTER — Other Ambulatory Visit (HOSPITAL_COMMUNITY): Payer: Self-pay

## 2022-08-05 ENCOUNTER — Other Ambulatory Visit (HOSPITAL_COMMUNITY): Payer: Self-pay

## 2022-08-10 ENCOUNTER — Other Ambulatory Visit: Payer: Self-pay

## 2022-08-17 ENCOUNTER — Other Ambulatory Visit (HOSPITAL_COMMUNITY): Payer: Self-pay

## 2022-08-17 MED ORDER — LOSARTAN POTASSIUM 50 MG PO TABS
50.0000 mg | ORAL_TABLET | Freq: Every day | ORAL | 2 refills | Status: DC
  Filled 2022-08-17: qty 90, 90d supply, fill #0
  Filled 2022-09-23 – 2022-11-11 (×2): qty 90, 90d supply, fill #1

## 2022-09-23 ENCOUNTER — Other Ambulatory Visit: Payer: Self-pay

## 2022-10-15 DIAGNOSIS — C7A8 Other malignant neuroendocrine tumors: Secondary | ICD-10-CM | POA: Diagnosis not present

## 2022-10-15 DIAGNOSIS — Z9049 Acquired absence of other specified parts of digestive tract: Secondary | ICD-10-CM | POA: Insufficient documentation

## 2022-10-15 DIAGNOSIS — Z79899 Other long term (current) drug therapy: Secondary | ICD-10-CM | POA: Diagnosis not present

## 2022-10-20 ENCOUNTER — Other Ambulatory Visit (HOSPITAL_COMMUNITY): Payer: Self-pay

## 2022-10-20 ENCOUNTER — Other Ambulatory Visit: Payer: Self-pay

## 2022-10-20 MED ORDER — PREVIDENT 5000 BOOSTER PLUS 1.1 % DT PSTE
PASTE | DENTAL | 3 refills | Status: DC
  Filled 2022-10-20: qty 100, 30d supply, fill #0
  Filled 2023-01-22: qty 100, 30d supply, fill #1
  Filled 2023-02-16: qty 100, 30d supply, fill #2
  Filled 2023-03-20: qty 100, 30d supply, fill #3

## 2022-10-21 LAB — 5 HIAA, QUANTITATIVE, URINE, 24 HOUR
5-HIAA, Ur: 1.3 mg/L
5-HIAA,Quant.,24 Hr Urine: 3.1 mg/24 hr (ref 0.0–14.9)
Total Volume: 2350

## 2022-10-23 ENCOUNTER — Other Ambulatory Visit: Payer: Self-pay

## 2022-10-23 ENCOUNTER — Inpatient Hospital Stay: Payer: 59 | Attending: Hematology

## 2022-10-23 ENCOUNTER — Inpatient Hospital Stay (HOSPITAL_BASED_OUTPATIENT_CLINIC_OR_DEPARTMENT_OTHER): Payer: 59 | Admitting: Hematology

## 2022-10-23 VITALS — BP 139/96 | HR 64 | Temp 97.7°F | Resp 16 | Wt 179.0 lb

## 2022-10-23 DIAGNOSIS — D3A8 Other benign neuroendocrine tumors: Secondary | ICD-10-CM

## 2022-10-23 DIAGNOSIS — Z79899 Other long term (current) drug therapy: Secondary | ICD-10-CM | POA: Diagnosis not present

## 2022-10-23 DIAGNOSIS — C7A8 Other malignant neuroendocrine tumors: Secondary | ICD-10-CM | POA: Diagnosis not present

## 2022-10-23 DIAGNOSIS — Z9049 Acquired absence of other specified parts of digestive tract: Secondary | ICD-10-CM | POA: Diagnosis not present

## 2022-10-23 LAB — COMPREHENSIVE METABOLIC PANEL
ALT: 13 U/L (ref 0–44)
AST: 28 U/L (ref 15–41)
Albumin: 4.6 g/dL (ref 3.5–5.0)
Alkaline Phosphatase: 78 U/L (ref 38–126)
Anion gap: 6 (ref 5–15)
BUN: 22 mg/dL — ABNORMAL HIGH (ref 6–20)
CO2: 28 mmol/L (ref 22–32)
Calcium: 9.5 mg/dL (ref 8.9–10.3)
Chloride: 104 mmol/L (ref 98–111)
Creatinine, Ser: 0.86 mg/dL (ref 0.44–1.00)
GFR, Estimated: >60 mL/min (ref >60)
Glucose, Bld: 78 mg/dL (ref 70–99)
Potassium: 4.1 mmol/L (ref 3.5–5.1)
Sodium: 138 mmol/L (ref 135–145)
Total Bilirubin: 0.5 mg/dL (ref 0.3–1.2)
Total Protein: 7.3 g/dL (ref 6.5–8.1)

## 2022-10-23 LAB — CBC WITH DIFFERENTIAL/PLATELET
Abs Immature Granulocytes: 0.02 10*3/uL (ref 0.00–0.07)
Basophils Absolute: 0 10*3/uL (ref 0.0–0.1)
Basophils Relative: 1 %
Eosinophils Absolute: 0.1 10*3/uL (ref 0.0–0.5)
Eosinophils Relative: 2 %
HCT: 38.9 % (ref 36.0–46.0)
Hemoglobin: 13.7 g/dL (ref 12.0–15.0)
Immature Granulocytes: 0 %
Lymphocytes Relative: 35 %
Lymphs Abs: 2.4 10*3/uL (ref 0.7–4.0)
MCH: 32.8 pg (ref 26.0–34.0)
MCHC: 35.2 g/dL (ref 30.0–36.0)
MCV: 93.1 fL (ref 80.0–100.0)
Monocytes Absolute: 0.4 10*3/uL (ref 0.1–1.0)
Monocytes Relative: 5 %
Neutro Abs: 4 10*3/uL (ref 1.7–7.7)
Neutrophils Relative %: 57 %
Platelets: 218 10*3/uL (ref 150–400)
RBC: 4.18 MIL/uL (ref 3.87–5.11)
RDW: 13.5 % (ref 11.5–15.5)
WBC: 6.9 10*3/uL (ref 4.0–10.5)
nRBC: 0 % (ref 0.0–0.2)

## 2022-10-23 NOTE — Progress Notes (Unsigned)
[facility]   [phone] [phone]   Clinic Follow up Note   Patient Care Team: Christen Butter, NP as PCP - General (Nurse Practitioner) Malachy Mood, MD as Consulting Physician (Hematology)  Date of Service:  10/23/2022  CHIEF COMPLAINT: f/u of neuroendocrine cancer   CURRENT THERAPY:  Surveillance  ASSESSMENT:  Lisa Glass is a 57 y.o. female with    1.  Well-differentiated neuroendocrine tumor of unknown primary -I have reviewed her outside medical record extensively, and confirmed the key findings with patient. -She presented with a enlarging mesenteric mass, initially was found on CT urogram scan in 2016.  Repeated CT scan in December 2021 showed enlarging mesenteric mass 3.5 x 3.3 cm, concerning for malignancy.  She underwent laparoscopic appendectomy with excision of the mesenteric mass, which showed well-differentiated neuroendocrine tumor with clear margins, mitotic rate rate 1/10hpfs and Ki-67 1 to 2%.  Appendix was removed during the surgery which was negative. -Netspot PET scan in February 2022 showed 2 areas of hypermetabolic activity over the right lower lobe without CT corresponding, and single hypermetabolic right inguinal node.  She subsequently underwent right inguinal lymph node biopsy which was negative.  EGD and colonoscopy was also negative for malignancy.  Decision was made to proceed with right ileocolectomy in June 2022, which was negative for primary tumor. -She has been under surveillance since the second surgery, last CT scan in September 2023 showed no evidence of recurrence. -He is clinically doing very well, asymptomatic, exam was unremarkable. -The mesenteric mass is likely a metastatic lymph node, is the primary site of her neuroendocrine tumor is still unclear, but likely in small bowel. -I recommend continued close surveillance.  We discussed the intermittent nature of this disease, and no need adjuvant therapy at this  point. -Will repeat labs today, and obtain surveillance CT scan in the next months. -Will continue follow-up every 4 months for 1-2 more years, then every 6 months.  Plan to repeat CT scan annually after the next 1. -We briefly discussed treatment options, such as Sandostatin injection, Afinitor, Lutathera if she has recurrent disease. -Due to her family history of ovarian cancer, I discussed genetic testing.  She has had with her GYN, probably no need to repeat in the near future.    PLAN: -lab reviewed -repeat CT scan in 4 months -I order Ct cap in 5 months   SUMMARY OF ONCOLOGIC HISTORY: Oncology History   No history exists.     INTERVAL HISTORY:  Lisa Glass is here for a follow up of neuroendocrine cancer. She was last seen by me on 3/21/25024. She presents to the clinic alone. Pt state that she has no issues. She overall clinically doing well.      All other systems were reviewed with the patient and are negative.  MEDICAL HISTORY:  Past Medical History:  Diagnosis Date   Allergy    Anemia    Back pain    Cancer (HCC)    neuroendocrine Grade I   Depression    Diverticulosis    Fatigue    High cholesterol    Hypertension    Renal disorder     SURGICAL HISTORY: Past Surgical History:  Procedure Laterality Date   APPENDECTOMY     04/16/20, excision of right lower quadrant mesenteric mass   ENDOMETRIAL ABLATION     kidney     removal   OTHER SURGICAL HISTORY     Biopsy R inguinal 05/28/20   URETERAL  REIMPLANTION Bilateral     I have reviewed the social history and family history with the patient and they are unchanged from previous note.  ALLERGIES:  has No Known Allergies.  MEDICATIONS:  Current Outpatient Medications  Medication Sig Dispense Refill   estradiol (ESTRACE) 0.1 MG/GM vaginal cream Place 0.5 g vaginally every evening for 1 week then use 2-3 times weekly as directed. 42 g 2   losartan (COZAAR) 25 MG tablet Take 1 tablet (25 mg total) by  mouth daily along with a 50mg  tablet for a total daily dose of 75mg . 90 tablet 2   losartan (COZAAR) 50 MG tablet Take 1 tablet (50 mg total) by mouth daily in addition to 25mg  tablet. 90 tablet 2   Multiple Vitamin (MULTI-VITAMIN) tablet Take 1 tablet by mouth daily.     phentermine (ADIPEX-P) 37.5 MG tablet Take 1 tablet (37.5 mg total) by mouth every morning for 30 days, THEN 0.5 tablets (18.75 mg total) in the morning for 14 days, THEN 0.5 tablets (18.75 mg total) every other day for 14 days, THEN 0.5 tablets (18.75 mg total) 2 (two) times a week for 14 days. 43 tablet 0   Sodium Fluoride (PREVIDENT 5000 BOOSTER PLUS) 1.1 % PSTE Apply a thin ribbon toothpaste to brush. Brush thoroughly once daily for 2 minutes, preferably at bedtime. For best results don't eat or drink for 30 minutes. 100 mL 3   Sodium Fluoride 1.1 % PSTE Apply a thin ribbon to toothbrush and brush thoroughly once daily for 2 minutes, preferably at bedtime. 100 mL 6   triamcinolone cream (KENALOG) 0.5 % Apply 1 application topically 2 (two) times daily. To affected areas. (Patient taking differently: Apply 1 application  topically as needed. To affected areas.) 30 g 1   valACYclovir (VALTREX) 1000 MG tablet Take 2 tablets (2,000 mg total) by mouth 2 (two) times daily as needed. 10 tablet 2   No current facility-administered medications for this visit.    PHYSICAL EXAMINATION: ECOG PERFORMANCE STATUS: {CHL ONC ECOG UJ:[id number]}  Vitals:   10/23/22 1458  BP: (!) 139/96  Pulse: 64  Resp: 16  Temp: 97.7 F (36.5 C)  SpO2: 98%   Wt Readings from Last 3 Encounters:  10/23/22 179 lb (81.2 kg)  07/29/22 187 lb 6.4 oz (85 kg)  06/25/22 192 lb 8 oz (87.3 kg)     GENERAL:alert, no distress and comfortable SKIN: skin color normal, no rashes or significant lesions EYES: normal, Conjunctiva are pink and non-injected, sclera clear  NEURO: alert & oriented x 3 with fluent speech NECK:(-) supple, thyroid normal size,  non-tender, without nodularity LYMPH:  (-)no palpable lymphadenopathy in the cervical, axillary  LUNGS: (-) clear to auscultation and percussion with normal breathing effort HEART: (-)regular rate & rhythm and no murmurs and (-)no lower extremity edema ABDOMEN:(-) abdomen soft,(-) non-tender and normal bowel sounds  LABORATORY DATA:  I have reviewed the data as listed    Latest Ref Rng & Units 10/23/2022    2:27 PM 06/25/2022    3:39 PM 03/30/2020    2:32 PM  CBC  WBC 4.0 - 10.5 K/uL 6.9  5.7  6.8   Hemoglobin 12.0 - 15.0 g/dL 56.2  13.0  86.5   Hematocrit 36.0 - 46.0 % 38.9  39.7  43.0   Platelets 150 - 400 K/uL 218  195  228         Latest Ref Rng & Units 10/23/2022    2:27 PM 06/25/2022  3:39 PM 03/30/2020    2:32 PM  CMP  Glucose 70 - 99 mg/dL 78  161  98   BUN 6 - 20 mg/dL 22  21  12    Creatinine 0.44 - 1.00 mg/dL 0.96  0.45  4.09   Sodium 135 - 145 mmol/L 138  136  139   Potassium 3.5 - 5.1 mmol/L 4.1  3.8  4.0   Chloride 98 - 111 mmol/L 104  104  103   CO2 22 - 32 mmol/L 28  25  25    Calcium 8.9 - 10.3 mg/dL 9.5  9.2  9.6   Total Protein 6.5 - 8.1 g/dL 7.3  7.5  7.4   Total Bilirubin 0.3 - 1.2 mg/dL 0.5  0.5  0.5   Alkaline Phos 38 - 126 U/L 78  77  72   AST 15 - 41 U/L 28  23  23    ALT 0 - 44 U/L 13  13  15        RADIOGRAPHIC STUDIES: I have personally reviewed the radiological images as listed and agreed with the findings in the report. No results found.    No orders of the defined types were placed in this encounter.  All questions were answered. The patient knows to call the clinic with any problems, questions or concerns. No barriers to learning was detected. The total time spent in the appointment was {CHL ONC TIME VISIT - WJXBJ:[id number]}.     Salome Holmes, CMA 10/23/2022   I, Monica Martinez, CMA, am acting as scribe for Malachy Mood, MD.   {Add scribe attestation statement}

## 2022-10-26 ENCOUNTER — Other Ambulatory Visit: Payer: Self-pay | Admitting: Medical-Surgical

## 2022-10-26 ENCOUNTER — Telehealth: Payer: Self-pay | Admitting: Hematology

## 2022-10-26 DIAGNOSIS — Z1231 Encounter for screening mammogram for malignant neoplasm of breast: Secondary | ICD-10-CM

## 2022-10-27 LAB — CHROMOGRANIN A: Chromogranin A (ng/mL): 40.6 ng/mL (ref 0.0–101.8)

## 2022-10-28 ENCOUNTER — Ambulatory Visit (INDEPENDENT_AMBULATORY_CARE_PROVIDER_SITE_OTHER): Payer: 59

## 2022-10-28 DIAGNOSIS — Z1231 Encounter for screening mammogram for malignant neoplasm of breast: Secondary | ICD-10-CM

## 2022-10-30 ENCOUNTER — Encounter: Payer: Self-pay | Admitting: Medical-Surgical

## 2022-10-30 ENCOUNTER — Other Ambulatory Visit: Payer: Self-pay | Admitting: Medical-Surgical

## 2022-10-30 DIAGNOSIS — R928 Other abnormal and inconclusive findings on diagnostic imaging of breast: Secondary | ICD-10-CM

## 2022-11-05 ENCOUNTER — Ambulatory Visit
Admission: RE | Admit: 2022-11-05 | Discharge: 2022-11-05 | Disposition: A | Discharge status: Home / Self Care | Payer: 59 | Source: Ambulatory Visit | Attending: Medical-Surgical | Admitting: Medical-Surgical

## 2022-11-05 DIAGNOSIS — R921 Mammographic calcification found on diagnostic imaging of breast: Secondary | ICD-10-CM | POA: Diagnosis not present

## 2022-11-05 DIAGNOSIS — R928 Other abnormal and inconclusive findings on diagnostic imaging of breast: Secondary | ICD-10-CM

## 2022-11-11 ENCOUNTER — Other Ambulatory Visit (HOSPITAL_COMMUNITY): Payer: Self-pay

## 2022-11-18 ENCOUNTER — Encounter: Payer: Self-pay | Admitting: Medical-Surgical

## 2022-11-23 ENCOUNTER — Encounter: Payer: 59 | Admitting: Medical-Surgical

## 2022-12-21 ENCOUNTER — Other Ambulatory Visit (HOSPITAL_COMMUNITY): Payer: Self-pay

## 2022-12-30 ENCOUNTER — Encounter: Payer: Self-pay | Admitting: Medical-Surgical

## 2022-12-30 ENCOUNTER — Ambulatory Visit (INDEPENDENT_AMBULATORY_CARE_PROVIDER_SITE_OTHER): Payer: 59 | Admitting: Medical-Surgical

## 2022-12-30 ENCOUNTER — Other Ambulatory Visit (HOSPITAL_COMMUNITY): Payer: Self-pay

## 2022-12-30 ENCOUNTER — Other Ambulatory Visit: Payer: Self-pay

## 2022-12-30 VITALS — BP 129/78 | HR 67 | Resp 20 | Ht 65.0 in | Wt 182.8 lb

## 2022-12-30 DIAGNOSIS — M7061 Trochanteric bursitis, right hip: Secondary | ICD-10-CM | POA: Diagnosis not present

## 2022-12-30 DIAGNOSIS — Z13220 Encounter for screening for lipoid disorders: Secondary | ICD-10-CM

## 2022-12-30 DIAGNOSIS — M7631 Iliotibial band syndrome, right leg: Secondary | ICD-10-CM | POA: Diagnosis not present

## 2022-12-30 DIAGNOSIS — Z0000 Encounter for general adult medical examination without abnormal findings: Secondary | ICD-10-CM | POA: Diagnosis not present

## 2022-12-30 MED ORDER — LOSARTAN POTASSIUM 50 MG PO TABS
50.0000 mg | ORAL_TABLET | Freq: Every day | ORAL | 2 refills | Status: DC
  Filled 2022-12-30 – 2023-02-16 (×2): qty 90, 90d supply, fill #0
  Filled 2023-06-05: qty 90, 90d supply, fill #1
  Filled 2023-09-08: qty 90, 90d supply, fill #2

## 2022-12-30 MED ORDER — LOSARTAN POTASSIUM 25 MG PO TABS
25.0000 mg | ORAL_TABLET | Freq: Every day | ORAL | 2 refills | Status: DC
  Filled 2022-12-30 – 2023-04-18 (×2): qty 90, 90d supply, fill #0
  Filled 2023-07-15: qty 90, 90d supply, fill #1
  Filled 2023-10-14: qty 90, 90d supply, fill #2

## 2022-12-30 MED ORDER — ESTRADIOL 0.1 MG/GM VA CREA
0.5000 g | TOPICAL_CREAM | Freq: Every evening | VAGINAL | 2 refills | Status: AC
  Filled 2022-12-30: qty 42.5, 90d supply, fill #0
  Filled 2023-11-18: qty 42.5, 90d supply, fill #1

## 2022-12-30 MED ORDER — TRIAMCINOLONE ACETONIDE 0.5 % EX CREA
1.0000 | TOPICAL_CREAM | CUTANEOUS | 1 refills | Status: AC | PRN
  Filled 2022-12-30: qty 15, 7d supply, fill #0
  Filled 2023-06-30: qty 15, 7d supply, fill #1

## 2022-12-30 MED ORDER — PREDNISONE 20 MG PO TABS
40.0000 mg | ORAL_TABLET | Freq: Every day | ORAL | 0 refills | Status: DC
  Filled 2022-12-30 (×2): qty 10, 5d supply, fill #0

## 2022-12-30 NOTE — Progress Notes (Signed)
Complete physical exam  Patient: Lisa Glass   DOB: Jan 20, 1966   57 y.o. Female  MRN: [id number]  Subjective:    Chief Complaint  Patient presents with   Annual Exam    Lisa Glass is a 57 y.o. female who presents today for a complete physical exam. She reports consuming a general diet.  Doing regular intentional exercises  She generally feels well. She reports sleeping well. She does not have additional problems to discuss today.    Most recent fall risk assessment:    07/29/2022    8:10 AM  Fall Risk   Falls in the past year? 0  Number falls in past yr: 0  Injury with Fall? 0  Risk for fall due to : No Fall Risks  Follow up Falls evaluation completed     Most recent depression screenings:    07/29/2022    8:10 AM 06/15/2022    1:40 PM  PHQ 2/9 Scores  PHQ - 2 Score 0 0    Vision:Within last year, Dental: No current dental problems and Receives regular dental care, and STD: The patient denies history of sexually transmitted disease.    Patient Care Team: Christen Butter, NP as PCP - General (Nurse Practitioner) Malachy Mood, MD as Consulting Physician (Hematology)   Outpatient Medications Prior to Visit  Medication Sig   Multiple Vitamin (MULTI-VITAMIN) tablet Take 1 tablet by mouth daily.   Sodium Fluoride (PREVIDENT 5000 BOOSTER PLUS) 1.1 % PSTE Apply a thin ribbon toothpaste to brush. Brush thoroughly once daily for 2 minutes, preferably at bedtime. For best results don't eat or drink for 30 minutes.   Sodium Fluoride 1.1 % PSTE Apply a thin ribbon to toothbrush and brush thoroughly once daily for 2 minutes, preferably at bedtime.   valACYclovir (VALTREX) 1000 MG tablet Take 2 tablets (2,000 mg total) by mouth 2 (two) times daily as needed.   [DISCONTINUED] estradiol (ESTRACE) 0.1 MG/GM vaginal cream Place 0.5 g vaginally every evening for 1 week then use 2-3 times weekly as directed.   [DISCONTINUED] losartan (COZAAR) 25 MG tablet Take 1 tablet (25 mg total) by  mouth daily along with a 50mg  tablet for a total daily dose of 75mg .   [DISCONTINUED] losartan (COZAAR) 50 MG tablet Take 1 tablet (50 mg total) by mouth daily in addition to 25mg  tablet.   [DISCONTINUED] triamcinolone cream (KENALOG) 0.5 % Apply 1 application topically 2 (two) times daily. To affected areas. (Patient taking differently: Apply 1 application  topically as needed. To affected areas.)   [DISCONTINUED] phentermine (ADIPEX-P) 37.5 MG tablet Take 1 tablet (37.5 mg total) by mouth every morning for 30 days, THEN 0.5 tablets (18.75 mg total) in the morning for 14 days, THEN 0.5 tablets (18.75 mg total) every other day for 14 days, THEN 0.5 tablets (18.75 mg total) 2 (two) times a week for 14 days.   No facility-administered medications prior to visit.   Review of Systems  Constitutional:  Negative for chills, fever, malaise/fatigue and weight loss.  HENT:  Negative for congestion, ear pain, hearing loss, sinus pain and sore throat.   Eyes:  Negative for blurred vision, photophobia and pain.  Respiratory:  Negative for cough, shortness of breath and wheezing.   Cardiovascular:  Negative for chest pain, palpitations and leg swelling.  Gastrointestinal:  Negative for abdominal pain, constipation, diarrhea, heartburn, nausea and vomiting.  Genitourinary:  Negative for dysuria, frequency and urgency.  Musculoskeletal:  Positive for joint pain (right hip pain).  Negative for falls and neck pain.  Skin:  Negative for itching and rash.  Neurological:  Negative for dizziness, weakness and headaches.  Endo/Heme/Allergies:  Negative for polydipsia. Does not bruise/bleed easily.  Psychiatric/Behavioral:  Negative for depression, substance abuse and suicidal ideas. The patient is not nervous/anxious and does not have insomnia.       Objective:     BP 129/78 (BP Location: Left Arm, Cuff Size: Normal)   Pulse 67   Resp 20   Ht 5\' 5"  (1.651 m)   Wt 182 lb 12.8 oz (82.9 kg)   SpO2 96%   BMI  30.42 kg/m    Physical Exam Vitals reviewed.  Constitutional:      General: She is not in acute distress.    Appearance: Normal appearance. She is obese. She is not ill-appearing.  HENT:     Head: Normocephalic and atraumatic.     Right Ear: Tympanic membrane, ear canal and external ear normal. There is no impacted cerumen.     Left Ear: Tympanic membrane, ear canal and external ear normal. There is no impacted cerumen.     Nose: Nose normal. No congestion or rhinorrhea.     Mouth/Throat:     Mouth: Mucous membranes are moist.     Pharynx: No oropharyngeal exudate or posterior oropharyngeal erythema.  Eyes:     General: No scleral icterus.       Right eye: No discharge.        Left eye: No discharge.     Extraocular Movements: Extraocular movements intact.     Conjunctiva/sclera: Conjunctivae normal.     Pupils: Pupils are equal, round, and reactive to light.  Neck:     Thyroid: No thyromegaly.     Vascular: No carotid bruit or JVD.     Trachea: Trachea normal.  Cardiovascular:     Rate and Rhythm: Normal rate and regular rhythm.     Pulses: Normal pulses.     Heart sounds: Normal heart sounds. No murmur heard.    No friction rub. No gallop.  Pulmonary:     Effort: Pulmonary effort is normal. No respiratory distress.     Breath sounds: Normal breath sounds. No wheezing.  Abdominal:     General: Bowel sounds are normal. There is no distension.     Palpations: Abdomen is soft.     Tenderness: There is no abdominal tenderness. There is no guarding.  Musculoskeletal:     Cervical back: Normal range of motion and neck supple.     Right hip: Decreased range of motion.     Left hip: Normal.     Comments: Trochanteric tenderness to the right hip.  Lymphadenopathy:     Cervical: No cervical adenopathy.  Skin:    General: Skin is warm and dry.  Neurological:     Mental Status: She is alert and oriented to person, place, and time.     Cranial Nerves: No cranial nerve deficit.   Psychiatric:        Mood and Affect: Mood normal.        Behavior: Behavior normal.        Thought Content: Thought content normal.        Judgment: Judgment normal.      No results found for any visits on 12/30/22.     Assessment & Plan:    Routine Health Maintenance and Physical Exam  Immunization History  Administered Date(s) Administered   Influenza, Seasonal, Injecte, Preservative Fre 01/13/2016, 01/14/2017,  01/13/2018   Influenza-Unspecified 01/27/2013, 02/02/2014, 01/17/2015, 01/13/2016, 01/04/2021, 01/18/2022   PFIZER(Purple Top)SARS-COV-2 Vaccination 04/01/2019, 04/24/2019, 01/12/2020   Tdap 01/05/2011   Zoster Recombinant(Shingrix) 08/15/2019    Health Maintenance  Topic Date Due   Cervical Cancer Screening (HPV/Pap Cotest)  10/27/2020   DTaP/Tdap/Td (2 - Td or Tdap) 01/04/2021   COVID-19 Vaccine (4 - 2023-24 season) 01/15/2023 (Originally 12/06/2022)   INFLUENZA VACCINE  07/05/2023 (Originally 11/05/2022)   MAMMOGRAM  11/04/2024   Colonoscopy  06/12/2030   HPV VACCINES  Aged Out   Hepatitis C Screening  Discontinued   HIV Screening  Discontinued   Zoster Vaccines- Shingrix  Discontinued    Discussed health benefits of physical activity, and encouraged her to engage in regular exercise appropriate for her age and condition.  1. Annual physical exam Recent CBC and CMP.  Checking lipids.  Up-to-date on preventative care.  Wellness information provided with AVS. - Lipid panel  2. Lipid screening Checking lipids. - Lipid panel  3. Trochanteric bursitis of right hip 4. Iliotibial band syndrome of right side Former history as an [place] and reports that she is right leg dominant.  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  Has been exercising regularly and now has some significant hip pain.  Her range of motion is limited by pain.  Symptoms consistent with trochanteric bursitis and IT band syndrome.  Home exercises printed and given to  patient.  We are doing a prednisone burst of 40 mg daily for 5 days.  Okay to switch to anti-inflammatories such as Aleve 1 tablet twice daily thereafter.  If no improvement in 4 to 6 weeks with conservative measures, return for evaluation with Dr. Karie Schwalbe.  Return in about 6 months (around 06/29/2023) for chronic disease follow up.   Christen Butter, NP

## 2022-12-31 LAB — LIPID PANEL
Chol/HDL Ratio: 2.8 ratio (ref 0.0–4.4)
Cholesterol, Total: 200 mg/dL — ABNORMAL HIGH (ref 100–199)
HDL: 72 mg/dL (ref >39)
LDL Chol Calc (NIH): 102 mg/dL — ABNORMAL HIGH (ref 0–99)
Triglycerides: 153 mg/dL — ABNORMAL HIGH (ref 0–149)
VLDL Cholesterol Cal: 26 mg/dL (ref 5–40)

## 2023-01-22 ENCOUNTER — Other Ambulatory Visit: Payer: Self-pay

## 2023-01-25 ENCOUNTER — Encounter: Payer: Self-pay | Admitting: Medical-Surgical

## 2023-01-25 ENCOUNTER — Other Ambulatory Visit (HOSPITAL_COMMUNITY): Payer: Self-pay

## 2023-02-16 ENCOUNTER — Other Ambulatory Visit: Payer: Self-pay

## 2023-03-10 ENCOUNTER — Telehealth: Payer: Self-pay | Admitting: Hematology

## 2023-03-13 ENCOUNTER — Encounter: Payer: Self-pay | Admitting: Medical-Surgical

## 2023-03-15 ENCOUNTER — Other Ambulatory Visit: Payer: Self-pay | Admitting: Medical-Surgical

## 2023-03-18 ENCOUNTER — Ambulatory Visit: Payer: 59

## 2023-03-18 DIAGNOSIS — Z905 Acquired absence of kidney: Secondary | ICD-10-CM | POA: Diagnosis not present

## 2023-03-18 DIAGNOSIS — D3A8 Other benign neuroendocrine tumors: Secondary | ICD-10-CM | POA: Diagnosis not present

## 2023-03-18 DIAGNOSIS — R911 Solitary pulmonary nodule: Secondary | ICD-10-CM | POA: Diagnosis not present

## 2023-03-18 MED ORDER — IOHEXOL 300 MG/ML  SOLN
100.0000 mL | Freq: Once | INTRAMUSCULAR | Status: AC | PRN
  Administered 2023-03-18: 100 mL via INTRAVENOUS

## 2023-03-20 ENCOUNTER — Other Ambulatory Visit (HOSPITAL_COMMUNITY): Payer: Self-pay

## 2023-03-21 NOTE — Assessment & Plan Note (Signed)
Stage: II vs IV, cT2cN0pMx, mitotic index low at 1 mitosis/10 hpfs and ki 67 index 1-2%  -mesentery mass was initially found on CT in 2016, due to enlarging mass on CT, she underwent laparoscopic appendectomy with excision of mesenteric mass on 04/16/2020 which showed well differentiated neuroendocrine tumor (carcinoid tumor), foci suspicious for LVI, background appendix without diagnostic abnormality. Comment: tumor located in the mesentery and approaches the soft tissue margin without definitive involvement, mitotic index low at 1 mitosis/10 hpfs and ki 67 index 1-2%. Entire appendix is negative for tumor. -05/08/2020 NETSPOT PET: Two areas of hypermetabolic activity over the right lower lobe but no abnormality in the CT in either of these areas, scattered nonspecific areas of activity over bowel, single hypermetabolic right inguinal node. -05/28/2020 US guided biopsy of right inguinal LN: negative -Patient underwent robotic ileocolectomy on 10/02/2020 and pathology showed unremarkable segment of terminal ileum and colon with a surgically absent appendix, no residual neoplasm or malignancy identified, 0/19 benign lymph nodes  -she has been on surveillance since then, no evidence of disease on last CT scan on 03/18/2023

## 2023-03-22 ENCOUNTER — Inpatient Hospital Stay: Payer: 59 | Attending: Hematology | Admitting: Hematology

## 2023-03-22 ENCOUNTER — Encounter: Payer: Self-pay | Admitting: Hematology

## 2023-03-22 ENCOUNTER — Other Ambulatory Visit: Payer: 59

## 2023-03-22 ENCOUNTER — Inpatient Hospital Stay: Payer: 59

## 2023-03-22 VITALS — BP 132/87 | HR 73 | Temp 97.7°F | Resp 17 | Ht 65.0 in | Wt 191.6 lb

## 2023-03-22 DIAGNOSIS — C7A8 Other malignant neuroendocrine tumors: Secondary | ICD-10-CM | POA: Insufficient documentation

## 2023-03-22 DIAGNOSIS — R197 Diarrhea, unspecified: Secondary | ICD-10-CM | POA: Insufficient documentation

## 2023-03-22 DIAGNOSIS — Z79899 Other long term (current) drug therapy: Secondary | ICD-10-CM | POA: Diagnosis not present

## 2023-03-22 DIAGNOSIS — Z9049 Acquired absence of other specified parts of digestive tract: Secondary | ICD-10-CM | POA: Diagnosis not present

## 2023-03-22 DIAGNOSIS — D3A8 Other benign neuroendocrine tumors: Secondary | ICD-10-CM

## 2023-03-22 LAB — COMPREHENSIVE METABOLIC PANEL
ALT: 12 U/L (ref 0–44)
AST: 23 U/L (ref 15–41)
Albumin: 4.3 g/dL (ref 3.5–5.0)
Alkaline Phosphatase: 69 U/L (ref 38–126)
Anion gap: 4 — ABNORMAL LOW (ref 5–15)
BUN: 19 mg/dL (ref 6–20)
CO2: 29 mmol/L (ref 22–32)
Calcium: 9.5 mg/dL (ref 8.9–10.3)
Chloride: 105 mmol/L (ref 98–111)
Creatinine, Ser: 0.97 mg/dL (ref 0.44–1.00)
GFR, Estimated: >60 mL/min (ref >60)
Glucose, Bld: 81 mg/dL (ref 70–99)
Potassium: 4.3 mmol/L (ref 3.5–5.1)
Sodium: 138 mmol/L (ref 135–145)
Total Bilirubin: 0.5 mg/dL (ref <1.2)
Total Protein: 6.6 g/dL (ref 6.5–8.1)

## 2023-03-22 LAB — CBC WITH DIFFERENTIAL/PLATELET
Abs Immature Granulocytes: 0.01 10*3/uL (ref 0.00–0.07)
Basophils Absolute: 0 10*3/uL (ref 0.0–0.1)
Basophils Relative: 1 %
Eosinophils Absolute: 0.1 10*3/uL (ref 0.0–0.5)
Eosinophils Relative: 3 %
HCT: 40 % (ref 36.0–46.0)
Hemoglobin: 13.5 g/dL (ref 12.0–15.0)
Immature Granulocytes: 0 %
Lymphocytes Relative: 40 %
Lymphs Abs: 2.1 10*3/uL (ref 0.7–4.0)
MCH: 31.9 pg (ref 26.0–34.0)
MCHC: 33.8 g/dL (ref 30.0–36.0)
MCV: 94.6 fL (ref 80.0–100.0)
Monocytes Absolute: 0.4 10*3/uL (ref 0.1–1.0)
Monocytes Relative: 7 %
Neutro Abs: 2.6 10*3/uL (ref 1.7–7.7)
Neutrophils Relative %: 49 %
Platelets: 201 10*3/uL (ref 150–400)
RBC: 4.23 MIL/uL (ref 3.87–5.11)
RDW: 12.8 % (ref 11.5–15.5)
WBC: 5.1 10*3/uL (ref 4.0–10.5)
nRBC: 0 % (ref 0.0–0.2)

## 2023-03-22 NOTE — Progress Notes (Signed)
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 Comment: tumor located in the mesentery and approaches the soft tissue margin without definitive involvement, mitotic index low at 1 mitosis/10 hpfs and ki 67 index 1-2%. Entire appendix is negative for tumor. -05/08/2020 NETSPOT PET: Two areas of hypermetabolic activity over the right lower lobe but no abnormality in the CT in either of these areas, scattered nonspecific areas of activity over bowel, single hypermetabolic right inguinal node. -05/28/2020 US guided biopsy of right inguinal LN: negative -Patient underwent robotic ileocolectomy on 10/02/2020 and pathology showed unremarkable segment of terminal ileum and colon with a surgically absent appendix, no residual neoplasm or malignancy identified, 0/19 benign lymph nodes  -she has been on surveillance since then, no evidence of disease on last CT scan on 03/18/2023   Assessment and Plan    Neuroendocrine Tumor Follow-up for neuroendocrine tumor diagnosed in January 2022 with surgery in June 2022. Recent CT scan shows no concerns. Blood counts, tumor markers, and kidney/liver function tests from four months ago were normal. No symptoms such as gastrointestinal issues,  bleeding, or flushing reported. Monitoring for recurrence, especially in the liver, will continue for up to ten years due to the slow-growing nature of the tumor. Discussed the option of less frequent scans if labs remain stable. - Order lab tests today - Schedule lab tests in six months - Schedule CT scan in one year - Schedule follow-up appointment in six months - Plan for 24-hour urine collection before next visit  General Health Maintenance Annual checkup with family doctor, last done in October. - Continue annual checkups with family doctor.      Plan -CT scan reviewed, no evidence of disease -Lab today -Lab and follow-up in 6 months    Discussed the use of AI scribe software for clinical note transcription with the patient, who gave verbal consent to proceed.  History of Present Illness   The patient, a 57 year old with a history of neuroendocrine tumor, presents for a routine follow-up. She reports no new symptoms and overall feels well. She has noticed some weight gain, which she attributes to recent travel and holiday indulgences. She reports occasional diarrhea, typically after morning coffee, but denies any persistent gastrointestinal symptoms such as cramping, bloating, or bleeding.  The patient was diagnosed with a neuroendocrine tumor in 2022, initially thought to be a reactive lymph node from a urinary tract infection. She underwent surgery in June 2022. Since then, she has been closely monitored with regular lab work and imaging. The most recent CT scan showed no concerning findings.         All other systems were reviewed with the patient and are negative.  MEDICAL HISTORY:  Past Medical History:  Diagnosis Date   Allergy    Anemia    Back pain    Cancer (HCC)  neuroendocrine Grade I   Depression    Diverticulosis    Fatigue    High cholesterol    Hypertension    Renal disorder     SURGICAL HISTORY: Past Surgical History:  Procedure Laterality Date    APPENDECTOMY     04/16/20, excision of right lower quadrant mesenteric mass   ENDOMETRIAL ABLATION     kidney     removal   OTHER SURGICAL HISTORY     Biopsy R inguinal 05/28/20   URETERAL REIMPLANTION Bilateral     I have reviewed the social history and family history with the patient and they are unchanged from previous note.  ALLERGIES:  has no known allergies.  MEDICATIONS:  Current Outpatient Medications  Medication Sig Dispense Refill   estradiol (ESTRACE) 0.1 MG/GM vaginal cream Place 0.5 g vaginally every evening for 1 week then use 2-3 times weekly as directed. 42.5 g 2   losartan (COZAAR) 25 MG tablet Take 1 tablet (25 mg total) by mouth daily along with a 50mg  tablet for a total daily dose of 75mg . 90 tablet 2   losartan (COZAAR) 50 MG tablet Take 1 tablet (50 mg total) by mouth daily in addition to 25mg  tablet. 90 tablet 2   Multiple Vitamin (MULTI-VITAMIN) tablet Take 1 tablet by mouth daily.     Sodium Fluoride (PREVIDENT 5000 BOOSTER PLUS) 1.1 % PSTE Apply a thin ribbon to brush. Brush thoroughly once daily for 2 minutes, preferably at bedtime. For best results don't eat or drink for 30 minutes. 100 mL 3   Sodium Fluoride 1.1 % PSTE Apply a thin ribbon to toothbrush and brush thoroughly once daily for 2 minutes, preferably at bedtime. 100 mL 6   triamcinolone cream (KENALOG) 0.5 % Apply 1 Application topically as needed. To affected areas. 15 g 1   valACYclovir (VALTREX) 1000 MG tablet Take 2 tablets (2,000 mg total) by mouth 2 (two) times daily as needed. 10 tablet 2   No current facility-administered medications for this visit.    PHYSICAL EXAMINATION: ECOG PERFORMANCE STATUS: 0 - Asymptomatic  Vitals:   03/22/23 0801 03/22/23 0804  BP: (!) 137/90 132/87  Pulse: 73   Resp: 17   Temp: 97.7 F (36.5 C)   SpO2: 99%    Wt Readings from Last 3 Encounters:  03/22/23 191 lb 9.6 oz (86.9 kg)  12/30/22 182 lb 12.8 oz (82.9 kg)  10/23/22 179 lb (81.2 kg)      GENERAL:alert, no distress and comfortable SKIN: skin color, texture, turgor are normal, no rashes or significant lesions EYES: normal, Conjunctiva are pink and non-injected, sclera clear NECK: supple, thyroid normal size, non-tender, without nodularity LYMPH:  no palpable lymphadenopathy in the cervical, axillary  LUNGS: clear to auscultation and percussion with normal breathing effort HEART: regular rate & rhythm and no murmurs and no lower extremity edema ABDOMEN:abdomen soft, non-tender and normal bowel sounds Musculoskeletal:no cyanosis of digits and no clubbing  NEURO: alert & oriented x 3 with fluent speech, no focal motor/sensory deficits   LABORATORY DATA:  I have reviewed the data as listed    Latest Ref Rng & Units 03/22/2023    8:32 AM 10/23/2022    2:27 PM 06/25/2022    3:39 PM  CBC  WBC 4.0 - 10.5 K/uL 5.1  6.9  5.7   Hemoglobin 12.0 - 15.0 g/dL 56.2  13.0  86.5   Hematocrit 36.0 - 46.0 % 40.0  38.9  39.7   Platelets 150 - 400 K/uL  201  218  195         Latest Ref Rng & Units 03/22/2023    8:32 AM 10/23/2022    2:27 PM 06/25/2022    3:39 PM  CMP  Glucose 70 - 99 mg/dL 81  78  161   BUN 6 - 20 mg/dL 19  22  21    Creatinine 0.44 - 1.00 mg/dL 0.96  0.45  4.09   Sodium 135 - 145 mmol/L 138  138  136   Potassium 3.5 - 5.1 mmol/L 4.3  4.1  3.8   Chloride 98 - 111 mmol/L 105  104  104   CO2 22 - 32 mmol/L 29  28  25    Calcium 8.9 - 10.3 mg/dL 9.5  9.5  9.2   Total Protein 6.5 - 8.1 g/dL 6.6  7.3  7.5   Total Bilirubin <1.2 mg/dL 0.5  0.5  0.5   Alkaline Phos 38 - 126 U/L 69  78  77   AST 15 - 41 U/L 23  28  23    ALT 0 - 44 U/L 12  13  13        RADIOGRAPHIC STUDIES: I have personally reviewed the radiological images as listed and agreed with the findings in the report. No results found.    No orders of the defined types were placed in this encounter.  All questions were answered. The patient knows to call the clinic with any problems, questions or  concerns. No barriers to learning was detected. The total time spent in the appointment was 25 minutes.     Malachy Mood, MD 03/22/2023

## 2023-03-23 ENCOUNTER — Other Ambulatory Visit (HOSPITAL_COMMUNITY): Payer: Self-pay

## 2023-03-23 MED ORDER — PREVIDENT 5000 BOOSTER PLUS 1.1 % DT PSTE
PASTE | DENTAL | 3 refills | Status: AC
  Filled 2023-03-23 – 2023-06-05 (×2): qty 100, 30d supply, fill #0
  Filled 2023-09-08: qty 100, 30d supply, fill #1
  Filled 2024-02-11: qty 100, 30d supply, fill #2

## 2023-03-24 LAB — CHROMOGRANIN A: Chromogranin A (ng/mL): 66.9 ng/mL (ref 0.0–101.8)

## 2023-03-25 ENCOUNTER — Ambulatory Visit: Payer: 59 | Admitting: Hematology

## 2023-03-29 ENCOUNTER — Encounter (HOSPITAL_COMMUNITY): Payer: Self-pay

## 2023-03-29 ENCOUNTER — Ambulatory Visit: Payer: 59 | Admitting: Medical-Surgical

## 2023-03-29 ENCOUNTER — Other Ambulatory Visit (HOSPITAL_COMMUNITY): Payer: Self-pay

## 2023-03-29 ENCOUNTER — Other Ambulatory Visit: Payer: Self-pay

## 2023-03-29 VITALS — BP 124/75 | HR 78 | Resp 20 | Ht 65.0 in | Wt 190.6 lb

## 2023-03-29 DIAGNOSIS — Z7689 Persons encountering health services in other specified circumstances: Secondary | ICD-10-CM | POA: Diagnosis not present

## 2023-03-29 DIAGNOSIS — Z833 Family history of diabetes mellitus: Secondary | ICD-10-CM | POA: Diagnosis not present

## 2023-03-29 LAB — POCT GLYCOSYLATED HEMOGLOBIN (HGB A1C): Hemoglobin A1C: 5.4 % (ref 4.0–5.6)

## 2023-03-29 MED ORDER — PHENTERMINE HCL 37.5 MG PO TABS
37.5000 mg | ORAL_TABLET | Freq: Every morning | ORAL | 0 refills | Status: DC
  Filled 2023-03-29 – 2023-03-30 (×2): qty 30, 30d supply, fill #0

## 2023-03-29 NOTE — Progress Notes (Signed)
        Established patient visit  History, exam, impression, and plan:  1. Encounter for weight management (Primary) Pleasant 57 year old female presenting today for discussion on weight management.  She has been off of phentermine for approximately 6 months and reports that she is having significant difficulty with increased appetite.  Reports that is gotten bad and she has not noted a return in weight.  She is still exercising doing mostly cardio but some weight training.  She is very interested in restarting phentermine.  Discussed the limitations of phentermine and the risks versus benefits of using a stimulant medication.  She is aware and would like to proceed.  Starting phentermine 37.5 mg daily.  Plan to treat with high-dose phentermine for 3 months followed by a 80-month break.  2. Family history of diabetes mellitus Reports that her identical twin sister was just diagnosed as type II diabetic.  Her father is also diabetic.  Hemoglobin A1c last checked in 2022.  Rechecking today with result of 5.4%.   Procedures performed this visit: None.  Return in about 4 weeks (around 04/26/2023) for weight check.  __________________________________ Thayer Ohm, DNP, APRN, FNP-BC Primary Care and Sports Medicine [facility] [place]

## 2023-03-29 NOTE — Addendum Note (Signed)
Addended by: Latanya Presser on: 03/29/2023 04:42 PM   Modules accepted: Orders

## 2023-03-30 ENCOUNTER — Other Ambulatory Visit: Payer: Self-pay

## 2023-03-30 ENCOUNTER — Other Ambulatory Visit (HOSPITAL_COMMUNITY): Payer: Self-pay

## 2023-04-19 ENCOUNTER — Other Ambulatory Visit: Payer: Self-pay

## 2023-04-28 ENCOUNTER — Encounter: Payer: Self-pay | Admitting: Medical-Surgical

## 2023-04-28 ENCOUNTER — Other Ambulatory Visit (HOSPITAL_COMMUNITY): Payer: Self-pay

## 2023-04-28 ENCOUNTER — Other Ambulatory Visit: Payer: Self-pay | Admitting: Medical-Surgical

## 2023-04-30 ENCOUNTER — Other Ambulatory Visit (HOSPITAL_COMMUNITY): Payer: Self-pay

## 2023-04-30 ENCOUNTER — Ambulatory Visit: Payer: 59 | Admitting: Medical-Surgical

## 2023-04-30 ENCOUNTER — Ambulatory Visit: Payer: 59

## 2023-04-30 ENCOUNTER — Encounter: Payer: Self-pay | Admitting: Medical-Surgical

## 2023-04-30 VITALS — BP 128/86 | HR 88 | Resp 20 | Ht 65.0 in | Wt 186.6 lb

## 2023-04-30 DIAGNOSIS — Z7689 Persons encountering health services in other specified circumstances: Secondary | ICD-10-CM

## 2023-04-30 DIAGNOSIS — Z713 Dietary counseling and surveillance: Secondary | ICD-10-CM | POA: Diagnosis not present

## 2023-04-30 MED ORDER — PHENTERMINE HCL 37.5 MG PO TABS
37.5000 mg | ORAL_TABLET | Freq: Every morning | ORAL | 0 refills | Status: DC
  Filled 2023-04-30: qty 30, 30d supply, fill #0

## 2023-04-30 NOTE — Progress Notes (Signed)
        Established patient visit  History, exam, impression, and plan:  1. Encounter for weight management (Primary) Pleasant 58 year old female presenting today to follow-up on phentermine for weight management.  She has been taking 37.5 mg daily, tolerating well without side effects.  She has been on this for 1 month and feels that the medication does not work as well this time around as it did when she first tried it.  She has only lost about 3-1/2-4 pounds in the last 30 days.  Notes that she is forcing herself to eat breakfast now and aiming for high-protein intake.  Lunch is usually a chicken with salad.  She is doing step aerobics several times a week.  Blood pressure is at goal, no tachycardia or palpitations. Feels that her goal weight would be around 155 pounds and admits that this feels unattainable at this time.  She is not interested in doing weight loss shots with GLP-1's at this time.  Aware that phentermine is only for 3 months then will require a 17-month break.  Since she is tolerating it well and making efforts at portion control, exercise, and healthier options, we will plan to continue phentermine for another month then follow-up for weight check.     Procedures performed this visit: None.  Return in about 4 weeks (around 05/28/2023) for weight check.  __________________________________ Thayer Ohm, DNP, APRN, FNP-BC Primary Care and Sports Medicine [facility] [place]

## 2023-05-04 ENCOUNTER — Other Ambulatory Visit (HOSPITAL_COMMUNITY): Payer: Self-pay

## 2023-05-04 ENCOUNTER — Other Ambulatory Visit: Payer: Self-pay | Admitting: Medical-Surgical

## 2023-05-04 ENCOUNTER — Other Ambulatory Visit: Payer: Self-pay

## 2023-05-04 MED ORDER — PHENTERMINE HCL 37.5 MG PO TABS
37.5000 mg | ORAL_TABLET | Freq: Every morning | ORAL | 0 refills | Status: DC
  Filled 2023-05-04 – 2023-05-05 (×2): qty 30, 30d supply, fill #0

## 2023-05-05 ENCOUNTER — Other Ambulatory Visit (HOSPITAL_COMMUNITY): Payer: Self-pay

## 2023-05-05 ENCOUNTER — Encounter: Payer: Self-pay | Admitting: Medical-Surgical

## 2023-05-07 ENCOUNTER — Telehealth: Payer: Self-pay

## 2023-05-07 NOTE — Telephone Encounter (Signed)
In chart showing phentermine was resent on 05/04/2023 date of request.
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
## 2023-05-20 DIAGNOSIS — Z1151 Encounter for screening for human papillomavirus (HPV): Secondary | ICD-10-CM | POA: Diagnosis not present

## 2023-05-20 DIAGNOSIS — Z124 Encounter for screening for malignant neoplasm of cervix: Secondary | ICD-10-CM | POA: Diagnosis not present

## 2023-05-20 DIAGNOSIS — Z1389 Encounter for screening for other disorder: Secondary | ICD-10-CM | POA: Diagnosis not present

## 2023-05-20 DIAGNOSIS — N951 Menopausal and female climacteric states: Secondary | ICD-10-CM | POA: Diagnosis not present

## 2023-05-20 DIAGNOSIS — Z01419 Encounter for gynecological examination (general) (routine) without abnormal findings: Secondary | ICD-10-CM | POA: Diagnosis not present

## 2023-05-20 DIAGNOSIS — R319 Hematuria, unspecified: Secondary | ICD-10-CM | POA: Diagnosis not present

## 2023-05-20 DIAGNOSIS — Z01411 Encounter for gynecological examination (general) (routine) with abnormal findings: Secondary | ICD-10-CM | POA: Diagnosis not present

## 2023-05-20 DIAGNOSIS — R102 Pelvic and perineal pain: Secondary | ICD-10-CM | POA: Diagnosis not present

## 2023-05-25 ENCOUNTER — Encounter: Payer: Self-pay | Admitting: Nurse Practitioner

## 2023-05-28 ENCOUNTER — Ambulatory Visit: Payer: 59 | Admitting: Medical-Surgical

## 2023-05-28 ENCOUNTER — Other Ambulatory Visit: Payer: Self-pay

## 2023-05-28 ENCOUNTER — Other Ambulatory Visit (HOSPITAL_COMMUNITY): Payer: Self-pay

## 2023-05-28 MED ORDER — MEDROXYPROGESTERONE ACETATE 10 MG PO TABS
10.0000 mg | ORAL_TABLET | Freq: Every day | ORAL | 0 refills | Status: DC
  Filled 2023-05-28: qty 36, 84d supply, fill #0

## 2023-05-28 MED ORDER — ESTRADIOL 0.05 MG/24HR TD PTWK
MEDICATED_PATCH | TRANSDERMAL | 1 refills | Status: DC
Start: 2023-05-28 — End: 2023-11-01
  Filled 2023-05-28: qty 12, 84d supply, fill #0
  Filled 2023-08-10: qty 12, 84d supply, fill #1
  Filled 2023-08-13: qty 4, 28d supply, fill #1
  Filled 2023-09-08: qty 4, 28d supply, fill #2
  Filled 2023-10-06: qty 4, 28d supply, fill #3

## 2023-05-29 ENCOUNTER — Other Ambulatory Visit (HOSPITAL_COMMUNITY): Payer: Self-pay

## 2023-06-03 DIAGNOSIS — R102 Pelvic and perineal pain: Secondary | ICD-10-CM | POA: Diagnosis not present

## 2023-06-05 ENCOUNTER — Other Ambulatory Visit (HOSPITAL_COMMUNITY): Payer: Self-pay

## 2023-06-07 ENCOUNTER — Other Ambulatory Visit: Payer: Self-pay

## 2023-06-08 ENCOUNTER — Other Ambulatory Visit: Payer: Self-pay

## 2023-06-14 ENCOUNTER — Other Ambulatory Visit: Payer: Self-pay | Admitting: Medical-Surgical

## 2023-06-14 ENCOUNTER — Other Ambulatory Visit (HOSPITAL_COMMUNITY): Payer: Self-pay

## 2023-06-14 MED ORDER — VALACYCLOVIR HCL 1 G PO TABS
2000.0000 mg | ORAL_TABLET | Freq: Two times a day (BID) | ORAL | 2 refills | Status: DC | PRN
  Filled 2023-06-14: qty 10, 3d supply, fill #0
  Filled 2023-08-20: qty 10, 3d supply, fill #1
  Filled 2023-11-18: qty 10, 3d supply, fill #2

## 2023-06-15 ENCOUNTER — Other Ambulatory Visit: Payer: Self-pay

## 2023-06-29 ENCOUNTER — Ambulatory Visit: Payer: 59 | Admitting: Medical-Surgical

## 2023-08-05 ENCOUNTER — Ambulatory Visit: Admitting: Medical-Surgical

## 2023-08-05 ENCOUNTER — Encounter: Payer: Self-pay | Admitting: Medical-Surgical

## 2023-08-05 ENCOUNTER — Other Ambulatory Visit: Payer: Self-pay

## 2023-08-05 ENCOUNTER — Other Ambulatory Visit (HOSPITAL_COMMUNITY): Payer: Self-pay

## 2023-08-05 VITALS — BP 118/80 | HR 59 | Resp 20 | Ht 65.0 in | Wt 190.3 lb

## 2023-08-05 DIAGNOSIS — I10 Essential (primary) hypertension: Secondary | ICD-10-CM | POA: Diagnosis not present

## 2023-08-05 DIAGNOSIS — Z7689 Persons encountering health services in other specified circumstances: Secondary | ICD-10-CM | POA: Diagnosis not present

## 2023-08-05 DIAGNOSIS — L309 Dermatitis, unspecified: Secondary | ICD-10-CM

## 2023-08-05 MED ORDER — NYSTATIN-TRIAMCINOLONE 100000-0.1 UNIT/GM-% EX OINT
1.0000 | TOPICAL_OINTMENT | Freq: Two times a day (BID) | CUTANEOUS | 1 refills | Status: AC
  Filled 2023-08-05: qty 60, 30d supply, fill #0

## 2023-08-05 MED ORDER — PHENTERMINE HCL 37.5 MG PO TABS
37.5000 mg | ORAL_TABLET | Freq: Every morning | ORAL | 0 refills | Status: DC
  Filled 2023-08-05: qty 30, 30d supply, fill #0

## 2023-08-05 NOTE — Progress Notes (Signed)
        Established patient visit  History, exam, impression, and plan:  1. Dermatitis of lip (Primary) Pleasant 58 year old female presenting today with complaints of recurrent dermatitis of the lips.  This has been going on for approximately 1 year where she has remissions with full disappearance followed by recurrence.  Notes that the dermatitis starts in the corners of her lips and then spreads to the upper lip then the lower lip.  Has not found anything that exacerbates it.  Has been using Lotrimin twice daily as well as triamcinolone  twice daily without benefit.  Notes that the rash gets very dry and flaky.  If she puts anything on it, it burns and then itches.  Presentation consistent with angular cheilitis.  Suspect a fungal component.  Discussed options for treatment.  Since Lotrimin has not been helpful, we will trial nystatin  twice daily to the affected area for 1-3 weeks as needed.  Avoid excess moisture in the area to promote healing.  2. Primary hypertension Long history of primary hypertension that is associated with having a kidney.  Followed by nephrology who put her on losartan  75 mg greater than 10 years ago.  Taking medication as prescribed, tolerating well without side effects.  Denies concerning symptoms today.  Cardiopulmonary exam normal.  Blood pressure at goal.  Continue losartan  75 mg daily.  3. Encounter for weight management Previously using phentermine  37.5 mg daily however never went back to pick up the last prescription due to work schedule and life stressors.  Reports that she is still struggling with obesity as well as difficulty making the lifestyle changes work with her schedule.  Would like to pick up the last month of phentermine  even though she has missed the last few months.  She has tolerated it well in the past with no issues so sending that last prescription in today.  She has also reached out to [employer] and they have a Psychiatrist for Zepbound  that  would be cheaper than going through her insurance or paying out-of-pocket.  She is considering this as an option for weight management but will let me know if she would like to proceed.  She is welcome to send me a MyChart message regarding this and we can get that started.  Procedures performed this visit: None.  Return in about 6 months (around 02/05/2024) for annual physical exam.  __________________________________ Maryl Snook, DNP, APRN, FNP-BC Primary Care and Sports Medicine [facility] [place]

## 2023-08-10 ENCOUNTER — Other Ambulatory Visit (HOSPITAL_COMMUNITY): Payer: Self-pay

## 2023-08-11 ENCOUNTER — Other Ambulatory Visit (HOSPITAL_COMMUNITY): Payer: Self-pay

## 2023-08-11 ENCOUNTER — Other Ambulatory Visit: Payer: Self-pay

## 2023-08-12 ENCOUNTER — Other Ambulatory Visit (HOSPITAL_COMMUNITY): Payer: Self-pay

## 2023-08-12 MED ORDER — MEDROXYPROGESTERONE ACETATE 10 MG PO TABS
ORAL_TABLET | ORAL | 3 refills | Status: AC
Start: 2023-08-12 — End: ?
  Filled 2023-08-12: qty 36, 90d supply, fill #0
  Filled 2023-10-29 – 2023-11-18 (×2): qty 36, 90d supply, fill #1
  Filled 2024-02-11: qty 36, 90d supply, fill #2
  Filled 2024-02-29 – 2024-03-03 (×2): qty 36, 90d supply, fill #3

## 2023-08-13 ENCOUNTER — Other Ambulatory Visit (HOSPITAL_COMMUNITY): Payer: Self-pay

## 2023-09-08 ENCOUNTER — Other Ambulatory Visit: Payer: Self-pay

## 2023-09-17 ENCOUNTER — Other Ambulatory Visit: Payer: Self-pay

## 2023-09-17 DIAGNOSIS — D3A8 Other benign neuroendocrine tumors: Secondary | ICD-10-CM

## 2023-09-17 DIAGNOSIS — C7A00 Malignant carcinoid tumor of unspecified site: Secondary | ICD-10-CM

## 2023-09-19 NOTE — Progress Notes (Unsigned)
 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 Comment: tumor located in the mesentery and approaches the soft tissue margin without definitive involvement, mitotic index low at 1 mitosis/10 hpfs and ki 67 index 1-2%. Entire appendix is negative for tumor. -05/08/2020 NETSPOT  PET: Two areas of hypermetabolic activity over the right lower lobe but no abnormality in the CT in either of these areas, scattered nonspecific areas of activity over bowel, single hypermetabolic right inguinal node. -05/28/2020 US  guided biopsy of right inguinal LN: negative -Patient underwent robotic ileocolectomy on 10/02/2020 and pathology showed unremarkable segment of terminal ileum and colon with a surgically absent appendix, no residual neoplasm or malignancy identified, 0/19 benign lymph nodes  -she has been on surveillance since then, no evidence of disease on last CT scan on 03/18/2023.   -surveillance CT CAP due in December 2025. This was ordered at today's visit.    Perimenopause Patient started with estradiol  transdermal patch at 0.05 mg.  Changes the patch weekly.  Also taking progesterone 10 mg daily for first 12 days of each month.  States she has noticed less joint pain and achiness as well as less brain fog.  Does not have menstrual period.  Had endometrial ablation 16 or so years ago.  Has not had a menstrual  period since then.  Plan Labs reviewed. - CBC and CMP are stable and unremarkable. - Chromogranin a results are pending. -She has needed equipment to collect a 24 hour urine.  Surveillance CT CAP should be scheduled for December 2025. Labs and follow-up in 6 months (1 to 2 weeks following CT).  She can follow-up sooner if needed.  The patient understands the plans discussed today and is in agreement with them.  She knows to contact our office if she develops concerns prior to her next appointment.  I provided 25 minutes of face-to-face time during this encounter and > 50% was spent counseling as documented under my assessment and plan.    Lisa Deis, NP  [facility] - A DEPT OF [facility] [address] [place] [address] Dept: [phone] Dept Fax: [phone]   Orders Placed This Encounter  Procedures   CT CHEST ABDOMEN PELVIS W CONTRAST    Standing Status:   Future    Expected Date:   03/21/2024    Expiration Date:   09/19/2024    If indicated for the ordered procedure, I authorize the administration of contrast media per Radiology protocol:   Yes    Does the patient have a contrast media/X-ray dye allergy?:   No    Preferred imaging location?:   [facility]    If indicated for the ordered procedure, I authorize the administration of oral contrast media per Radiology protocol:   Yes      CHIEF COMPLAINT:  CC: Neuroendocrine tumor  Current Treatment: Surveillance  INTERVAL HISTORY:  Lisa Glass is here  today for repeat clinical assessment.  She was last seen by Dr. Maryalice Glass on 03/22/2023.  Surveillance CT CAP due in December 2025, this was ordered during today's visit.  She states that she is doing well overall.  She does have some pelvic discomfort.  Seeing her GYN provider for this.  Started estradiol  patch, 0.05 mg weekly.  Taking progesterone 10 mg daily for first 12 days of each month.  Has  helped with joint pain and brain fog.She denies chest pain, chest pressure, or shortness of breath. She denies headaches or visual disturbances. He denies abdominal pain, nausea, vomiting, or changes in bowel or bladder habits.  She denies fevers or chills. She denies pain. Her appetite is good. Her weight has been stable.  I have reviewed the past medical history, past surgical history, social history and family history with the patient and they are unchanged from previous note.  ALLERGIES:  has no known allergies.  MEDICATIONS:  Current Outpatient Medications  Medication Sig Dispense Refill   estradiol  (CLIMARA  - DOSED IN MG/24 HR) 0.05 mg/24hr patch Apply 1 patch onto skin every week 12 patch 1   estradiol  (ESTRACE ) 0.1 MG/GM vaginal cream Place 0.5 g vaginally every evening for 1 week then use 2-3 times weekly as directed. 42.5 g 2   losartan  (COZAAR ) 25 MG tablet Take 1 tablet (25 mg total) by mouth daily along with a 50mg  tablet for a total daily dose of 75mg . 90 tablet 2   losartan  (COZAAR ) 50 MG tablet Take 1 tablet (50 mg total) by mouth daily in addition to 25mg  tablet. 90 tablet 2   medroxyPROGESTERone  (PROVERA ) 10 MG tablet Take 1 tablet by mouth every day for the first 12 days of the month 36 tablet 3   Multiple Vitamin (MULTI-VITAMIN) tablet Take 1 tablet by mouth daily.     nystatin -triamcinolone  ointment (MYCOLOG) Apply topically to affected area twice daily as directed. 60 g 1   Sodium Fluoride  (PREVIDENT  5000 BOOSTER PLUS) 1.1 % PSTE Apply a thin ribbon toothpaste to brush. Brush thoroughly once daily for 2 minutes, preferably at bedtime. For best results don't eat or drink for 30 minutes. 100 mL 3   Sodium Fluoride  1.1 % PSTE Apply a thin ribbon to toothbrush and brush thoroughly once daily for 2 minutes, preferably at bedtime. 100 mL 6   triamcinolone  cream (KENALOG ) 0.5 % Apply 1 Application topically as needed. To affected areas. 15 g 1   valACYclovir  (VALTREX ) 1000 MG tablet  Take 2 tablets (2,000 mg total) by mouth 2 (two) times daily as needed. 10 tablet 2   No current facility-administered medications for this visit.    REVIEW OF SYSTEMS:   Constitutional: Denies fevers, chills or abnormal weight loss Eyes: Denies blurriness of vision Ears, nose, mouth, throat, and face: Denies mucositis or sore throat Respiratory: Denies cough, dyspnea or wheezes Cardiovascular: Denies palpitation, chest discomfort or lower extremity swelling Gastrointestinal:  Denies nausea, heartburn or change in bowel habits Skin: Denies abnormal skin rashes Lymphatics: Denies new lymphadenopathy or easy bruising Neurological:Denies numbness, tingling or new weaknesses Behavioral/Psych: Mood is stable, no new changes  All other systems were reviewed with the patient and are negative.   VITALS:   Today's Vitals   09/20/23 0833 09/20/23 0838  BP: 122/78   Pulse: 70   Resp: 17   Temp: 98.3 F (36.8 C)   TempSrc: Temporal   SpO2: 99%   Weight: 189 lb 8 oz (86 kg)   Height: 5' 5 (  1.651 m)   PainSc:  0-No pain   Body mass index is 31.53 kg/m.   Wt Readings from Last 3 Encounters:  09/20/23 189 lb 8 oz (86 kg)  08/05/23 190 lb 4.8 oz (86.3 kg)  04/30/23 186 lb 9.6 oz (84.6 kg)    Body mass index is 31.53 kg/m.  Performance status (ECOG): 0 - Asymptomatic  PHYSICAL EXAM:   GENERAL:alert, no distress and comfortable SKIN: skin color, texture, turgor are normal, no rashes or significant lesions EYES: normal, Conjunctiva are pink and non-injected, sclera clear OROPHARYNX:no exudate, no erythema and lips, buccal mucosa, and tongue normal  NECK: supple, thyroid normal size, non-tender, without nodularity LYMPH:  no palpable lymphadenopathy in the cervical, axillary or inguinal LUNGS: clear to auscultation and percussion with normal breathing effort HEART: regular rate & rhythm and no murmurs and no lower extremity edema ABDOMEN:abdomen soft, non-tender and normal bowel  sounds Musculoskeletal:no cyanosis of digits and no clubbing  NEURO: alert & oriented x 3 with fluent speech, no focal motor/sensory deficits  LABORATORY DATA:  I have reviewed the data as listed    Component Value Date/Time   NA 141 09/20/2023 0748   K 4.3 09/20/2023 0748   CL 110 09/20/2023 0748   CO2 25 09/20/2023 0748   GLUCOSE 92 09/20/2023 0748   BUN 17 09/20/2023 0748   CREATININE 1.03 (H) 09/20/2023 0748   CREATININE 0.91 03/21/2020 0000   CALCIUM 8.5 (L) 09/20/2023 0748   PROT 6.6 09/20/2023 0748   ALBUMIN 4.1 09/20/2023 0748   AST 22 09/20/2023 0748   ALT 13 09/20/2023 0748   ALKPHOS 55 09/20/2023 0748   BILITOT 0.7 09/20/2023 0748   GFRNONAA >60 09/20/2023 0748   GFRNONAA 73 03/21/2020 0000   GFRAA 84 03/21/2020 0000    Lab Results  Component Value Date   WBC 4.9 09/20/2023   NEUTROABS 2.7 09/20/2023   HGB 13.3 09/20/2023   HCT 37.9 09/20/2023   MCV 92.4 09/20/2023   PLT 203 09/20/2023

## 2023-09-19 NOTE — Assessment & Plan Note (Signed)
 Stage: II vs IV, cT2cN0pMx, mitotic index low at 1 mitosis/10 hpfs and ki 67 index 1-2%  -mesentery mass was initially found on CT in 2016, due to enlarging mass on CT, she underwent laparoscopic appendectomy with excision of mesenteric mass on 04/16/2020 which showed well differentiated neuroendocrine tumor (carcinoid tumor), foci suspicious for LVI, background appendix without diagnostic abnormality. Comment: tumor located in the mesentery and approaches the soft tissue margin without definitive involvement, mitotic index low at 1 mitosis/10 hpfs and ki 67 index 1-2%. Entire appendix is negative for tumor. -05/08/2020 NETSPOT  PET: Two areas of hypermetabolic activity over the right lower lobe but no abnormality in the CT in either of these areas, scattered nonspecific areas of activity over bowel, single hypermetabolic right inguinal node. -05/28/2020 US  guided biopsy of right inguinal LN: negative -Patient underwent robotic ileocolectomy on 10/02/2020 and pathology showed unremarkable segment of terminal ileum and colon with a surgically absent appendix, no residual neoplasm or malignancy identified, 0/19 benign lymph nodes  -she has been on surveillance since then, no evidence of disease on last CT scan on 03/18/2023.  Plan for surveillance CT CAP in December 2025.

## 2023-09-20 ENCOUNTER — Inpatient Hospital Stay: Payer: 59

## 2023-09-20 ENCOUNTER — Telehealth: Payer: Self-pay | Admitting: Nurse Practitioner

## 2023-09-20 ENCOUNTER — Encounter: Payer: Self-pay | Admitting: Nurse Practitioner

## 2023-09-20 ENCOUNTER — Inpatient Hospital Stay: Payer: 59 | Attending: Hematology | Admitting: Nurse Practitioner

## 2023-09-20 VITALS — BP 122/78 | HR 70 | Temp 98.3°F | Resp 17 | Ht 65.0 in | Wt 189.5 lb

## 2023-09-20 DIAGNOSIS — D3A8 Other benign neuroendocrine tumors: Secondary | ICD-10-CM

## 2023-09-20 DIAGNOSIS — C7A8 Other malignant neuroendocrine tumors: Secondary | ICD-10-CM | POA: Diagnosis not present

## 2023-09-20 DIAGNOSIS — Z79899 Other long term (current) drug therapy: Secondary | ICD-10-CM | POA: Diagnosis not present

## 2023-09-20 DIAGNOSIS — Z79890 Hormone replacement therapy (postmenopausal): Secondary | ICD-10-CM | POA: Diagnosis not present

## 2023-09-20 DIAGNOSIS — M2550 Pain in unspecified joint: Secondary | ICD-10-CM | POA: Diagnosis not present

## 2023-09-20 DIAGNOSIS — C7A00 Malignant carcinoid tumor of unspecified site: Secondary | ICD-10-CM

## 2023-09-20 LAB — CBC WITH DIFFERENTIAL (CANCER CENTER ONLY)
Abs Immature Granulocytes: 0.01 10*3/uL (ref 0.00–0.07)
Basophils Absolute: 0 10*3/uL (ref 0.0–0.1)
Basophils Relative: 1 %
Eosinophils Absolute: 0.1 10*3/uL (ref 0.0–0.5)
Eosinophils Relative: 2 %
HCT: 37.9 % (ref 36.0–46.0)
Hemoglobin: 13.3 g/dL (ref 12.0–15.0)
Immature Granulocytes: 0 %
Lymphocytes Relative: 37 %
Lymphs Abs: 1.8 10*3/uL (ref 0.7–4.0)
MCH: 32.4 pg (ref 26.0–34.0)
MCHC: 35.1 g/dL (ref 30.0–36.0)
MCV: 92.4 fL (ref 80.0–100.0)
Monocytes Absolute: 0.3 10*3/uL (ref 0.1–1.0)
Monocytes Relative: 6 %
Neutro Abs: 2.7 10*3/uL (ref 1.7–7.7)
Neutrophils Relative %: 54 %
Platelet Count: 203 10*3/uL (ref 150–400)
RBC: 4.1 MIL/uL (ref 3.87–5.11)
RDW: 13 % (ref 11.5–15.5)
WBC Count: 4.9 10*3/uL (ref 4.0–10.5)
nRBC: 0 % (ref 0.0–0.2)

## 2023-09-20 LAB — CMP (CANCER CENTER ONLY)
ALT: 13 U/L (ref 0–44)
AST: 22 U/L (ref 15–41)
Albumin: 4.1 g/dL (ref 3.5–5.0)
Alkaline Phosphatase: 55 U/L (ref 38–126)
Anion gap: 6 (ref 5–15)
BUN: 17 mg/dL (ref 6–20)
CO2: 25 mmol/L (ref 22–32)
Calcium: 8.5 mg/dL — ABNORMAL LOW (ref 8.9–10.3)
Chloride: 110 mmol/L (ref 98–111)
Creatinine: 1.03 mg/dL — ABNORMAL HIGH (ref 0.44–1.00)
GFR, Estimated: >60 mL/min (ref >60)
Glucose, Bld: 92 mg/dL (ref 70–99)
Potassium: 4.3 mmol/L (ref 3.5–5.1)
Sodium: 141 mmol/L (ref 135–145)
Total Bilirubin: 0.7 mg/dL (ref 0.0–1.2)
Total Protein: 6.6 g/dL (ref 6.5–8.1)

## 2023-09-20 NOTE — Telephone Encounter (Signed)
 Scheduled appointments per 6/16 los. Talked with the patient and she is aware of the made appointments.

## 2023-09-22 LAB — CHROMOGRANIN A: Chromogranin A (ng/mL): 47.7 ng/mL (ref 0.0–101.8)

## 2023-10-07 ENCOUNTER — Other Ambulatory Visit: Payer: Self-pay

## 2023-10-07 ENCOUNTER — Other Ambulatory Visit (HOSPITAL_COMMUNITY): Payer: Self-pay

## 2023-10-29 ENCOUNTER — Other Ambulatory Visit (HOSPITAL_COMMUNITY): Payer: Self-pay

## 2023-10-30 ENCOUNTER — Other Ambulatory Visit (HOSPITAL_COMMUNITY): Payer: Self-pay

## 2023-11-01 ENCOUNTER — Other Ambulatory Visit (HOSPITAL_COMMUNITY): Payer: Self-pay

## 2023-11-01 MED ORDER — ESTRADIOL 0.05 MG/24HR TD PTWK
MEDICATED_PATCH | TRANSDERMAL | 1 refills | Status: DC
  Filled 2023-11-01 – 2023-11-05 (×2): qty 12, 84d supply, fill #0
  Filled 2024-01-27: qty 12, 84d supply, fill #1

## 2023-11-02 ENCOUNTER — Other Ambulatory Visit (HOSPITAL_COMMUNITY): Payer: Self-pay

## 2023-11-05 ENCOUNTER — Other Ambulatory Visit (HOSPITAL_COMMUNITY): Payer: Self-pay

## 2023-11-08 ENCOUNTER — Other Ambulatory Visit: Payer: Self-pay

## 2023-11-18 ENCOUNTER — Other Ambulatory Visit (HOSPITAL_COMMUNITY): Payer: Self-pay

## 2023-11-22 ENCOUNTER — Other Ambulatory Visit (HOSPITAL_BASED_OUTPATIENT_CLINIC_OR_DEPARTMENT_OTHER): Payer: Self-pay | Admitting: Medical-Surgical

## 2023-11-22 DIAGNOSIS — Z1231 Encounter for screening mammogram for malignant neoplasm of breast: Secondary | ICD-10-CM

## 2023-11-24 ENCOUNTER — Ambulatory Visit

## 2023-11-24 DIAGNOSIS — Z1231 Encounter for screening mammogram for malignant neoplasm of breast: Secondary | ICD-10-CM | POA: Diagnosis not present

## 2023-11-26 ENCOUNTER — Ambulatory Visit: Payer: Self-pay | Admitting: Medical-Surgical

## 2023-11-29 ENCOUNTER — Other Ambulatory Visit: Payer: Self-pay | Admitting: Medical-Surgical

## 2023-11-29 DIAGNOSIS — R928 Other abnormal and inconclusive findings on diagnostic imaging of breast: Secondary | ICD-10-CM

## 2023-12-07 ENCOUNTER — Other Ambulatory Visit: Payer: Self-pay | Admitting: Medical-Surgical

## 2023-12-07 ENCOUNTER — Ambulatory Visit
Admission: RE | Admit: 2023-12-07 | Discharge: 2023-12-07 | Disposition: A | Discharge status: Home / Self Care | Source: Ambulatory Visit | Attending: Medical-Surgical

## 2023-12-07 ENCOUNTER — Ambulatory Visit

## 2023-12-07 ENCOUNTER — Encounter: Payer: Self-pay | Admitting: Sports Medicine

## 2023-12-07 ENCOUNTER — Ambulatory Visit: Payer: Self-pay | Admitting: Medical-Surgical

## 2023-12-07 DIAGNOSIS — R928 Other abnormal and inconclusive findings on diagnostic imaging of breast: Secondary | ICD-10-CM

## 2023-12-08 ENCOUNTER — Other Ambulatory Visit: Payer: Self-pay

## 2023-12-09 ENCOUNTER — Other Ambulatory Visit (HOSPITAL_COMMUNITY): Payer: Self-pay

## 2023-12-15 ENCOUNTER — Other Ambulatory Visit: Payer: Self-pay | Admitting: Medical-Surgical

## 2023-12-16 ENCOUNTER — Other Ambulatory Visit: Payer: Self-pay | Admitting: Medical-Surgical

## 2023-12-16 ENCOUNTER — Other Ambulatory Visit (HOSPITAL_COMMUNITY): Payer: Self-pay

## 2023-12-16 MED ORDER — LOSARTAN POTASSIUM 50 MG PO TABS
50.0000 mg | ORAL_TABLET | Freq: Every day | ORAL | 0 refills | Status: DC
  Filled 2023-12-16: qty 30, 30d supply, fill #0

## 2023-12-16 NOTE — Telephone Encounter (Signed)
 Medication sent in today.  Copied from CRM [id number]. Topic: Clinical - Prescription Issue >> Dec 16, 2023  5:08 PM Lisa Glass wrote: Reason for CRM: Patient is calling because she only has 2 of the losartan  (COZAAR ) 50 MG tablet left and would like to make sure that is refill request will go through. The pharmacy is going to send in another request.

## 2023-12-20 ENCOUNTER — Other Ambulatory Visit (HOSPITAL_COMMUNITY): Payer: Self-pay

## 2023-12-20 MED ORDER — LOSARTAN POTASSIUM 50 MG PO TABS
50.0000 mg | ORAL_TABLET | Freq: Every day | ORAL | 0 refills | Status: DC
  Filled 2023-12-20: qty 30, 30d supply, fill #0

## 2023-12-20 NOTE — Telephone Encounter (Signed)
 Requesting rx rf of losartan  50mg   Last written 12/16/2023 (Losartan  25mg  last written 12/30/2022- patient did not request this as refill) but shows should be taken together  Last OV 08/05/2023 Upcoming appt 12/31/2023

## 2023-12-31 ENCOUNTER — Encounter: Payer: Self-pay | Admitting: Medical-Surgical

## 2023-12-31 ENCOUNTER — Other Ambulatory Visit: Payer: Self-pay

## 2023-12-31 ENCOUNTER — Other Ambulatory Visit (HOSPITAL_COMMUNITY): Payer: Self-pay

## 2023-12-31 ENCOUNTER — Ambulatory Visit (INDEPENDENT_AMBULATORY_CARE_PROVIDER_SITE_OTHER): Admitting: Medical-Surgical

## 2023-12-31 VITALS — BP 121/77 | HR 73 | Temp 98.6°F | Resp 20 | Ht 65.0 in | Wt 193.8 lb

## 2023-12-31 DIAGNOSIS — I10 Essential (primary) hypertension: Secondary | ICD-10-CM | POA: Diagnosis not present

## 2023-12-31 DIAGNOSIS — Z8619 Personal history of other infectious and parasitic diseases: Secondary | ICD-10-CM

## 2023-12-31 DIAGNOSIS — Z23 Encounter for immunization: Secondary | ICD-10-CM | POA: Diagnosis not present

## 2023-12-31 DIAGNOSIS — Z0000 Encounter for general adult medical examination without abnormal findings: Secondary | ICD-10-CM | POA: Insufficient documentation

## 2023-12-31 DIAGNOSIS — F439 Reaction to severe stress, unspecified: Secondary | ICD-10-CM | POA: Diagnosis not present

## 2023-12-31 DIAGNOSIS — Z833 Family history of diabetes mellitus: Secondary | ICD-10-CM | POA: Insufficient documentation

## 2023-12-31 MED ORDER — VALACYCLOVIR HCL 1 G PO TABS
2000.0000 mg | ORAL_TABLET | Freq: Two times a day (BID) | ORAL | 2 refills | Status: AC | PRN
  Filled 2023-12-31: qty 10, 3d supply, fill #0
  Filled 2024-01-27: qty 10, 3d supply, fill #1
  Filled 2024-04-13: qty 10, 3d supply, fill #2

## 2023-12-31 MED ORDER — LOSARTAN POTASSIUM 50 MG PO TABS
50.0000 mg | ORAL_TABLET | Freq: Every day | ORAL | 3 refills | Status: AC
  Filled 2023-12-31 – 2024-01-14 (×2): qty 90, 90d supply, fill #0
  Filled 2024-04-13: qty 90, 90d supply, fill #1

## 2023-12-31 MED ORDER — LOSARTAN POTASSIUM 25 MG PO TABS
25.0000 mg | ORAL_TABLET | Freq: Every day | ORAL | 3 refills | Status: AC
  Filled 2023-12-31: qty 90, 90d supply, fill #0
  Filled 2024-04-07: qty 90, 90d supply, fill #1

## 2023-12-31 MED ORDER — BUPROPION HCL ER (XL) 150 MG PO TB24
150.0000 mg | ORAL_TABLET | Freq: Every day | ORAL | 1 refills | Status: AC
  Filled 2023-12-31: qty 90, 90d supply, fill #0
  Filled 2024-03-25: qty 90, 90d supply, fill #1

## 2023-12-31 NOTE — Progress Notes (Signed)
 Complete physical exam  Patient: Lisa Glass   DOB: [birth year]   58 y.o. Female  MRN: [id number]  Subjective:    Chief Complaint  Patient presents with   Annual Exam    Lisa Glass is a 58 y.o. female who presents today for a complete physical exam. She reports consuming a general diet. Intermittent bouts of exercise. She generally feels well. She reports sleeping fairly well. She does not have additional problems to discuss today.    Most recent fall risk assessment:    07/29/2022    8:10 AM  Fall Risk   Falls in the past year? 0  Number falls in past yr: 0  Injury with Fall? 0  Risk for fall due to : No Fall Risks  Follow up Falls evaluation completed     Most recent depression screenings:    12/31/2023    9:15 AM 03/29/2023    3:22 PM  PHQ 2/9 Scores  PHQ - 2 Score 3 0  PHQ- 9 Score 7     Vision:Within last year and Dental: No current dental problems and Receives regular dental care    Patient Care Team: Willo Mini, NP as PCP - General (Nurse Practitioner) Lanny Callander, MD as Consulting Physician (Hematology)   Outpatient Medications Prior to Visit  Medication Sig   estradiol  (CLIMARA  - DOSED IN MG/24 HR) 0.05 mg/24hr patch Apply 1 patch onto skin every week   estradiol  (ESTRACE ) 0.1 MG/GM vaginal cream Place 0.5 g vaginally every evening for 1 week then use 2-3 times weekly as directed.   medroxyPROGESTERone  (PROVERA ) 10 MG tablet Take 1 tablet by mouth every day for the first 12 days of the month   Multiple Vitamin (MULTI-VITAMIN) tablet Take 1 tablet by mouth daily.   nystatin -triamcinolone  ointment (MYCOLOG) Apply topically to affected area twice daily as directed.   Sodium Fluoride  (PREVIDENT  5000 BOOSTER PLUS) 1.1 % PSTE Apply a thin ribbon toothpaste to brush. Brush thoroughly once daily for 2 minutes, preferably at bedtime. For best results don't eat or drink for 30 minutes.   Sodium Fluoride  1.1 % PSTE Apply a thin ribbon to toothbrush and brush  thoroughly once daily for 2 minutes, preferably at bedtime.   triamcinolone  cream (KENALOG ) 0.5 % Apply 1 Application topically as needed. To affected areas.   [DISCONTINUED] losartan  (COZAAR ) 25 MG tablet Take 1 tablet (25 mg total) by mouth daily along with a 50mg  tablet for a total daily dose of 75mg .   [DISCONTINUED] losartan  (COZAAR ) 50 MG tablet Take 1 tablet (50 mg total) by mouth daily in addition to 25mg  tablet.   [DISCONTINUED] valACYclovir  (VALTREX ) 1000 MG tablet Take 2 tablets (2,000 mg total) by mouth 2 (two) times daily as needed.   No facility-administered medications prior to visit.    Review of Systems  Constitutional:  Negative for chills, fever, malaise/fatigue and weight loss.  HENT:  Negative for congestion, ear pain, hearing loss, sinus pain and sore throat.   Eyes:  Negative for blurred vision, photophobia and pain.  Respiratory:  Positive for cough (post-viral, nonproductive). Negative for shortness of breath and wheezing.   Cardiovascular:  Negative for chest pain, palpitations and leg swelling.  Gastrointestinal:  Negative for abdominal pain, constipation, diarrhea, heartburn, nausea and vomiting.  Genitourinary:  Negative for dysuria, frequency and urgency.  Musculoskeletal:  Negative for falls and neck pain.  Skin:  Negative for itching and rash.  Neurological:  Negative for dizziness, weakness and headaches.  Endo/Heme/Allergies:  Negative for polydipsia. Does not bruise/bleed easily.  Psychiatric/Behavioral:  Negative for depression, substance abuse and suicidal ideas. The patient is not nervous/anxious and does not have insomnia.        Situational stress     Objective:    BP 121/77 (BP Location: Left Arm, Cuff Size: Normal)   Pulse 73   Temp 98.6 F (37 C) (Oral)   Resp 20   Ht 5' 5 (1.651 m)   Wt 193 lb 12.8 oz (87.9 kg)   SpO2 97%   BMI 32.25 kg/m    Physical Exam Vitals reviewed.  Constitutional:      General: She is not in acute  distress.    Appearance: Normal appearance.  HENT:     Head: Normocephalic and atraumatic.  Cardiovascular:     Rate and Rhythm: Normal rate and regular rhythm.     Pulses: Normal pulses.     Heart sounds: Normal heart sounds. No murmur heard.    No friction rub. No gallop.  Pulmonary:     Effort: Pulmonary effort is normal. No respiratory distress.     Breath sounds: Normal breath sounds. No wheezing.  Skin:    General: Skin is warm and dry.  Neurological:     Mental Status: She is alert and oriented to person, place, and time.  Psychiatric:        Mood and Affect: Mood normal.        Behavior: Behavior normal.        Thought Content: Thought content normal.        Judgment: Judgment normal.      No results found for any visits on 12/31/23.     Assessment & Plan:    Routine Health Maintenance and Physical Exam  Immunization History  Administered Date(s) Administered   Influenza, Seasonal, Injecte, Preservative Fre 01/13/2016, 01/14/2017, 01/13/2018   Influenza-Unspecified 01/27/2013, 02/02/2014, 01/17/2015, 01/13/2016, 01/04/2021, 01/18/2022, 01/19/2023   PFIZER(Purple Top)SARS-COV-2 Vaccination 04/01/2019, 04/24/2019, 01/12/2020   Pfizer(Comirnaty)Fall Seasonal Vaccine 12 years and older 12/31/2023   Tdap 01/05/2011   Zoster Recombinant(Shingrix ) 08/15/2019    Health Maintenance  Topic Date Due   Hepatitis B Vaccines 19-59 Average Risk (1 of 3 - 19+ 3-dose series) Never done   Pneumococcal Vaccine: 50+ Years (1 of 1 - PCV) Never done   DTaP/Tdap/Td (2 - Td or Tdap) 01/04/2021   Influenza Vaccine  07/04/2024 (Originally 11/05/2023)   COVID-19 Vaccine (5 - Pfizer risk 2024-25 season) 06/29/2024   Mammogram  11/23/2025   Cervical Cancer Screening (HPV/Pap Cotest)  05/19/2026   Colonoscopy  06/12/2030   HPV VACCINES  Aged Out   Meningococcal B Vaccine  Aged Out   Hepatitis C Screening  Discontinued   HIV Screening  Discontinued   Zoster Vaccines- Shingrix    Discontinued    Discussed health benefits of physical activity, and encouraged her to engage in regular exercise appropriate for her age and condition.  1. Annual physical exam (Primary) CBC and CMP completed with the cancer center in June, deferring recheck today.  Checking lipids.  Up-to-date on dental care but would recommend updating eye exam.  Wellness information provided with AVS. - Lipid panel  2. Family history of diabetes mellitus Checking hemoglobin A1c. - Hemoglobin A1c  3. Primary hypertension Currently well-managed with losartan  75 mg daily.  Checking lipids.  Continue losartan . - Lipid panel - losartan  (COZAAR ) 25 MG tablet; Take 1 tablet (25 mg total) by mouth daily along with a 50mg  tablet for a total  daily dose of 75mg .  Dispense: 90 tablet; Refill: 3 - losartan  (COZAAR ) 50 MG tablet; Take 1 tablet (50 mg total) by mouth daily in addition to 25mg  tablet.  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  Valtrex  effective.  Refilling for as needed use. - valACYclovir  (VALTREX ) 1000 MG tablet; Take 2 tablets (2,000 mg total) by mouth 2 (two) times daily as needed.  Dispense: 10 tablet; Refill: 2  5. Situational stress Has had some situational stress that is not well-managed.  Tried Wellbutrin  in the past with good results.  Not interested in counseling but would like to try medication.  Restarting Wellbutrin  XL 150 mg daily.  6.  Need for COVID-19 vaccine Patient desires booster for COVID-vaccine.  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  COVID-vaccine given in office today.  Return in about 6 months (around 06/29/2024) for HTN follow up or sooner if needed.     Keyron Pokorski, NP

## 2023-12-31 NOTE — Patient Instructions (Signed)
 Preventive Care 56-56 Years Old, Female  Preventive care refers to lifestyle choices and visits with your health care provider that can promote health and wellness. Preventive care visits are also called wellness exams.  What can I expect for my preventive care visit?  Counseling  Your health care provider may ask you questions about your:  Medical history, including:  Past medical problems.  Family medical history.  Pregnancy history.  Current health, including:  Menstrual cycle.  Method of birth control.  Emotional well-being.  Home life and relationship well-being.  Sexual activity and sexual health.  Lifestyle, including:  Alcohol, nicotine or tobacco, and drug use.  Access to firearms.  Diet, exercise, and sleep habits.  Work and work Astronomer.  Sunscreen use.  Safety issues such as seatbelt and bike helmet use.  Physical exam  Your health care provider will check your:  Height and weight. These may be used to calculate your BMI (body mass index). BMI is a measurement that tells if you are at a healthy weight.  Waist circumference. This measures the distance around your waistline. This measurement also tells if you are at a healthy weight and may help predict your risk of certain diseases, such as type 2 diabetes and high blood pressure.  Heart rate and blood pressure.  Body temperature.  Skin for abnormal spots.  What immunizations do I need?    Vaccines are usually given at various ages, according to a schedule. Your health care provider will recommend vaccines for you based on your age, medical history, and lifestyle or other factors, such as travel or where you work.  What tests do I need?  Screening  Your health care provider may recommend screening tests for certain conditions. This may include:  Lipid and cholesterol levels.  Diabetes screening. This is done by checking your blood sugar (glucose) after you have not eaten for a while (fasting).  Pelvic exam and Pap test.  Hepatitis B test.  Hepatitis C  test.  HIV (human immunodeficiency virus) test.  STI (sexually transmitted infection) testing, if you are at risk.  Lung cancer screening.  Colorectal cancer screening.  Mammogram. Talk with your health care provider about when you should start having regular mammograms. This may depend on whether you have a family history of breast cancer.  BRCA-related cancer screening. This may be done if you have a family history of breast, ovarian, tubal, or peritoneal cancers.  Bone density scan. This is done to screen for osteoporosis.  Talk with your health care provider about your test results, treatment options, and if necessary, the need for more tests.  Follow these instructions at home:  Eating and drinking    Eat a diet that includes fresh fruits and vegetables, whole grains, lean protein, and low-fat dairy products.  Take vitamin and mineral supplements as recommended by your health care provider.  Do not drink alcohol if:  Your health care provider tells you not to drink.  You are pregnant, may be pregnant, or are planning to become pregnant.  If you drink alcohol:  Limit how much you have to 0-1 drink a day.  Know how much alcohol is in your drink. In the [place] one drink equals one 12 oz bottle of beer (355 mL), one 5 oz glass of wine (148 mL), or one 1 oz glass of hard liquor (44 mL).  Lifestyle  Brush your teeth every morning and night with fluoride toothpaste. Floss one time each day.  Exercise for at least  30 minutes 5 or more days each week.  Do not use any products that contain nicotine or tobacco. These products include cigarettes, chewing tobacco, and vaping devices, such as e-cigarettes. If you need help quitting, ask your health care provider.  Do not use drugs.  If you are sexually active, practice safe sex. Use a condom or other form of protection to prevent STIs.  If you do not wish to become pregnant, use a form of birth control. If you plan to become pregnant, see your health care provider for a  prepregnancy visit.  Take aspirin only as told by your health care provider. Make sure that you understand how much to take and what form to take. Work with your health care provider to find out whether it is safe and beneficial for you to take aspirin daily.  Find healthy ways to manage stress, such as:  Meditation, yoga, or listening to music.  Journaling.  Talking to a trusted person.  Spending time with friends and family.  Minimize exposure to UV radiation to reduce your risk of skin cancer.  Safety  Always wear your seat belt while driving or riding in a vehicle.  Do not drive:  If you have been drinking alcohol. Do not ride with someone who has been drinking.  When you are tired or distracted.  While texting.  If you have been using any mind-altering substances or drugs.  Wear a helmet and other protective equipment during sports activities.  If you have firearms in your house, make sure you follow all gun safety procedures.  Seek help if you have been physically or sexually abused.  What's next?  Visit your health care provider once a year for an annual wellness visit.  Ask your health care provider how often you should have your eyes and teeth checked.  Stay up to date on all vaccines.  This information is not intended to replace advice given to you by your health care provider. Make sure you discuss any questions you have with your health care provider.  Document Revised: 09/18/2020 Document Reviewed: 09/18/2020  Elsevier Patient Education  2024 [employer].

## 2024-01-01 ENCOUNTER — Ambulatory Visit: Payer: Self-pay | Admitting: Medical-Surgical

## 2024-01-01 LAB — HEMOGLOBIN A1C
Est. average glucose Bld gHb Est-mCnc: 108 mg/dL
Hgb A1c MFr Bld: 5.4 % (ref 4.8–5.6)

## 2024-01-01 LAB — LIPID PANEL
Chol/HDL Ratio: 2.8 ratio (ref 0.0–4.4)
Cholesterol, Total: 176 mg/dL (ref 100–199)
HDL: 62 mg/dL (ref >39)
LDL Chol Calc (NIH): 102 mg/dL — ABNORMAL HIGH (ref 0–99)
Triglycerides: 62 mg/dL (ref 0–149)
VLDL Cholesterol Cal: 12 mg/dL (ref 5–40)

## 2024-01-14 ENCOUNTER — Other Ambulatory Visit: Payer: Self-pay

## 2024-01-14 ENCOUNTER — Other Ambulatory Visit (HOSPITAL_COMMUNITY): Payer: Self-pay

## 2024-01-27 ENCOUNTER — Other Ambulatory Visit (HOSPITAL_COMMUNITY): Payer: Self-pay

## 2024-02-11 ENCOUNTER — Other Ambulatory Visit (HOSPITAL_COMMUNITY): Payer: Self-pay

## 2024-03-01 ENCOUNTER — Other Ambulatory Visit (HOSPITAL_COMMUNITY): Payer: Self-pay

## 2024-03-01 ENCOUNTER — Other Ambulatory Visit: Payer: Self-pay

## 2024-03-04 ENCOUNTER — Other Ambulatory Visit (HOSPITAL_COMMUNITY): Payer: Self-pay

## 2024-03-07 ENCOUNTER — Other Ambulatory Visit (HOSPITAL_COMMUNITY): Payer: Self-pay

## 2024-03-16 ENCOUNTER — Ambulatory Visit
Admission: RE | Admit: 2024-03-16 | Discharge: 2024-03-16 | Disposition: A | Discharge status: Home / Self Care | Source: Ambulatory Visit | Attending: Medical-Surgical

## 2024-03-16 VITALS — BP 156/92 | HR 66 | Temp 98.9°F | Resp 18

## 2024-03-16 DIAGNOSIS — S0083XA Contusion of other part of head, initial encounter: Secondary | ICD-10-CM | POA: Diagnosis not present

## 2024-03-16 NOTE — ED Provider Notes (Signed)
 [facility]    CSN: [id number] Arrival date & time: 03/16/24  1551    HISTORY   Chief Complaint  Patient presents with   Head Injury    Entered by patient   HPI Lisa Glass is a pleasant, 58 y.o. female who presents to urgent care today. Patient states that yesterday evening, while laying her head and her husband's lap, he was playing with his cell phone and accidentally dropped it on her right temple.  Patient states that since then she has had intermittent pain in that area of her head and describes it as a throbbing sensation.  Patient rates her pain 2 out of 10 at its worst.  Patient denies vision changes, dizziness, altered mental status, abnormal gait, nausea.  The history is provided by the patient.  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   Renal disorder    S/P right hemicolectomy 10/08/2020   Patient Active Problem List   Diagnosis Date Noted   Annual physical exam 12/31/2023   Family history of diabetes mellitus 12/31/2023   Primary hypertension 12/31/2023   H/O cold sores 12/31/2023   Situational stress 12/31/2023   Primary osteoarthritis of one knee, right 06/25/2020   Premature menopause 08/15/2019   Acquired solitary kidney 04/20/2014   Irritant dermatitis 10/24/2013   Pilar cyst 04/03/2011   Past Surgical History:  Procedure Laterality Date   APPENDECTOMY     04/16/20, excision of right lower quadrant mesenteric mass   ENDOMETRIAL ABLATION     kidney     removal   OTHER SURGICAL HISTORY     Biopsy R inguinal 05/28/20   URETERAL REIMPLANTION Bilateral    OB History     Gravida  3   Para  3   Term  3   Preterm      AB      Living         SAB      IAB      Ectopic      Multiple      Live Births              Home Medications    Prior to Admission medications  Medication Sig Start Date End Date Taking? Authorizing Provider  buPROPion  (WELLBUTRIN  XL) 150 MG 24 hr tablet Take 1 tablet (150 mg total) by mouth daily. 12/31/23  Yes Willo Mini, NP  estradiol  (CLIMARA  - DOSED IN MG/24 HR) 0.05 mg/24hr patch Apply 1 patch onto skin every week 11/01/23  Yes   estradiol  (ESTRACE ) 0.1 MG/GM vaginal cream Place 0.5 g vaginally every evening for 1 week then use 2-3 times weekly as directed. Patient taking differently: Place 0.5 g vaginally daily as needed. 12/30/22  Yes Willo Mini, NP  losartan  (COZAAR ) 25 MG tablet Take 1 tablet (25 mg total) by mouth daily along with a 50mg  tablet for a total daily dose of 75mg . 12/31/23  Yes Willo Mini, NP  losartan  (COZAAR ) 50 MG tablet Take 1 tablet (50 mg total) by mouth daily in addition to 25mg  tablet. 12/31/23  Yes Willo Mini, NP  medroxyPROGESTERone  (PROVERA ) 10 MG tablet Take 1 tablet by mouth every day for the first  12 days of the month 08/12/23  Yes   Multiple Vitamin (MULTI-VITAMIN) tablet Take 1 tablet by mouth daily.   Yes [provider]  nystatin -triamcinolone  ointment (MYCOLOG) Apply topically to affected area twice daily as directed. 08/05/23  Yes Willo Mini, NP  Sodium Fluoride  (PREVIDENT  5000 BOOSTER PLUS) 1.1 % PSTE Apply a thin ribbon toothpaste to brush. Brush thoroughly once daily for 2 minutes, preferably at bedtime. For best results don't eat or drink for 30 minutes. 03/23/23  Yes   Sodium Fluoride  1.1 % PSTE Apply a thin ribbon to toothbrush and brush thoroughly once daily for 2 minutes, preferably at bedtime. 05/05/21  Yes   triamcinolone  cream (KENALOG ) 0.5 % Apply 1 Application topically as needed. To affected areas. 12/30/22  Yes Willo Mini, NP  valACYclovir  (VALTREX ) 1000 MG tablet Take 2 tablets (2,000 mg total) by mouth 2 (two) times daily as needed. 12/31/23  Yes Willo Mini, NP    Family History Family History  Problem Relation Age of  Onset   High blood pressure Mother    Cancer Father        skin melanoma   High blood pressure Father    High blood pressure Sister        Twin   Kidney disease Sister        solitary kidney   Ovarian cancer Maternal Aunt    Heart attack Maternal Uncle    Cirrhosis Maternal Uncle        w/ liver transplant   Kidney disease Cousin    Kidney disease Cousin    Colon cancer Neg Hx    Esophageal cancer Neg Hx    Pancreatic cancer Neg Hx    Stomach cancer Neg Hx    Colon polyps Neg Hx    Social History Social History[1] Allergies   Patient has no known allergies.  Review of Systems Review of Systems Pertinent findings revealed after performing a 14 point review of systems has been noted in the history of present illness.  Physical Exam Vital Signs BP (!) 156/92 (BP Location: Right Arm)   Pulse 66   Temp 98.9 F (37.2 C) (Oral)   Resp 18   SpO2 98%   No data found.  Physical Exam Vitals and nursing note reviewed.  Constitutional:      General: She is awake. She is not in acute distress.    Appearance: Normal appearance. She is well-developed and well-groomed. She is not ill-appearing.  Neurological:     General: No focal deficit present.     Mental Status: She is alert and oriented to person, place, and time. Mental status is at baseline.  Psychiatric:        Attention and Perception: Attention and perception normal.        Mood and Affect: Mood and affect normal.        Speech: Speech normal.        Behavior: Behavior normal. Behavior is cooperative.     Visual Acuity Right Eye Distance:   Left Eye Distance:   Bilateral Distance:    Right Eye Near:   Left Eye Near:    Bilateral Near:     UC Couse / Diagnostics / Procedures:     Radiology No results found.  Procedures Procedures (including critical care time) EKG  Pending results:  Labs Reviewed - No data to display  Medications Ordered in UC: Medications - No data to display  UC Diagnoses /  Final Clinical Impressions(s)  I have reviewed the triage vital signs and the nursing notes.  Pertinent labs & imaging results that were available during my care of the patient were reviewed by me and considered in my medical decision making (see chart for details).    Final diagnoses:  Contusion of other part of head, initial encounter   Patient advised watchful waiting is recommended.  Should she develop any signs of concussion such as vision changes, significantly worsening headache, nausea, vomiting, altered mental status, abnormal gait or dizziness she should go to the emergency room for further evaluation.  Patient verbalized understanding and was in agreement with plan.  Please see discharge instructions below for details of plan of care as provided to patient. ED Prescriptions   None    PDMP not reviewed this encounter.  Discharge Instructions   None     Disposition Upon Discharge:  Condition: stable for discharge home  Patient presented with an acute illness with associated systemic symptoms and significant discomfort requiring urgent management. In my opinion, this is a condition that a prudent lay person (someone who possesses an average knowledge of health and medicine) may potentially expect to result in complications if not addressed urgently such as respiratory distress, impairment of bodily function or dysfunction of bodily organs.   Routine symptom specific, illness specific and/or disease specific instructions were discussed with the patient and/or caregiver at length.   As such, the patient has been evaluated and assessed, work-up was performed and treatment was provided in alignment with urgent care protocols and evidence based medicine.  Patient/parent/caregiver has been advised that the patient may require follow up for further testing and treatment if the symptoms continue in spite of treatment, as clinically indicated and appropriate.  Patient/parent/caregiver  has been advised to return to the [facility] or PCP if no better; to PCP or the Emergency Department if new signs and symptoms develop, or if the current signs or symptoms continue to change or worsen for further workup, evaluation and treatment as clinically indicated and appropriate  The patient will follow up with their current PCP if and as advised. If the patient does not currently have a PCP we will assist them in obtaining one.   The patient may need specialty follow up if the symptoms continue, in spite of conservative treatment and management, for further workup, evaluation, consultation and treatment as clinically indicated and appropriate.  Patient/parent/caregiver verbalized understanding and agreement of plan as discussed.  All questions were addressed during visit.  Please see discharge instructions below for further details of plan.  This office note has been dictated using Teaching laboratory technician.  Unfortunately, this method of dictation can sometimes lead to typographical or grammatical errors.  I apologize for your inconvenience in advance if this occurs.  Please do not hesitate to reach out to me if clarification is needed.       [1]  Social History Tobacco Use   Smoking status: Never    Passive exposure: Never   Smokeless tobacco: Never  Vaping Use   Vaping status: Never Used  Substance Use Topics   Alcohol use: Yes    Comment: social drinker   Drug use: Never     Joesph Shaver Scales, PA-C 03/16/24 1651

## 2024-03-16 NOTE — ED Triage Notes (Signed)
 Patient states she had her head on her husband's knee and he dropped his phone which hit her right temple. She states she has had intermittent pain has been at a 2 since then and states it has been throbbing. No interventions attempted.

## 2024-03-21 ENCOUNTER — Ambulatory Visit

## 2024-03-21 DIAGNOSIS — D3A8 Other benign neuroendocrine tumors: Secondary | ICD-10-CM

## 2024-03-21 DIAGNOSIS — Z905 Acquired absence of kidney: Secondary | ICD-10-CM | POA: Diagnosis not present

## 2024-03-21 DIAGNOSIS — C7A1 Malignant poorly differentiated neuroendocrine tumors: Secondary | ICD-10-CM | POA: Diagnosis not present

## 2024-03-21 DIAGNOSIS — K820 Obstruction of gallbladder: Secondary | ICD-10-CM | POA: Diagnosis not present

## 2024-03-21 MED ORDER — IOHEXOL 300 MG/ML  SOLN
100.0000 mL | Freq: Once | INTRAMUSCULAR | Status: AC | PRN
  Administered 2024-03-21: 13:00:00 100 mL via INTRAVENOUS

## 2024-03-25 ENCOUNTER — Other Ambulatory Visit (HOSPITAL_COMMUNITY): Payer: Self-pay

## 2024-03-27 ENCOUNTER — Other Ambulatory Visit: Payer: Self-pay | Admitting: Nurse Practitioner

## 2024-03-27 DIAGNOSIS — D3A8 Other benign neuroendocrine tumors: Secondary | ICD-10-CM

## 2024-03-27 NOTE — Progress Notes (Addendum)
 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 Comment: tumor located in the mesentery and approaches the soft tissue margin without definitive involvement, mitotic index low at 1 mitosis/10 hpfs and ki 67 index 1-2%. Entire appendix is negative for tumor. -05/08/2020 NETSPOT  PET: Two areas of hypermetabolic activity over the right lower lobe but no abnormality in the CT in either of these areas, scattered nonspecific areas of activity over bowel, single hypermetabolic right inguinal node. -05/28/2020 US  guided biopsy of right inguinal LN: negative -Patient underwent robotic ileocolectomy on 10/02/2020 and pathology showed unremarkable segment of terminal ileum and colon with a surgically absent appendix, no residual neoplasm or malignancy identified, 0/19 benign lymph nodes  -she has been on surveillance since then, no evidence of disease on last CT scan on 03/18/2023.   -surveillance CT CAP from 03/21/2024 showed no evidence of residual disease, recurrence or metastasis in the chest, abdomen, or pelvis.  Small, stable lung nodule in the left lower lobe of the lungs.  This will be monitored closely.  Exam is benign today.  Plan to follow-up in 6 months, sooner if needed.  Repeat CT CAP in 1 year.   Neuroendocrine tumor Patient continues to do well.  She is on cancer surveillance.  She denies nausea,  vomiting, or diarrhea.  She denies constipation.  Denies evidence of flushing.  She had restaging CT CAP done on 03/21/2024.  There is no evidence of residual, recurrent, or metastatic disease in the chest, abdomen, or pelvis.  There is stable 0.5 cm nodule which will continue to monitor closely.  Chromogranin A biomarkers pending.  Will continue to follow her every 6 months and repeat CT CAP in 1 year.  Pelvic floor weakness Patient with urinary urgency and decreased sphincter control after 3 vaginal births, multiple abdominal surgeries.  She previously had pelvic floor physical therapy which was helpful.  States she will see her GYN provider in February 2026.  Plans to ask for additional pelvic floor PT to help manage symptoms.  Plan This was a shared visit with Dr.Feng.  Labs reviewed. -Stable CBC and CMP. - Awaiting results of today. CT CAP from 03/21/2024 showed no evidence of residual, recurrent, or metastatic disease in the chest, abdomen, or pelvis.  Stable 0.5 cm lung nodule in the left lung.  Will continue to monitor closely. Patient with benign exam overall. Plan for labs and follow-up in 6 months.  Repeat CT CAP in 12 months for surveillance.  The patient understands the plans discussed today and is in agreement with them.  She knows to contact our office if she develops concerns prior to her next appointment.    [name], NP  [facility] - A DEPT OF [facility] [address] [place] [address] Dept: [phone] Dept Fax: [phone]   No orders of the defined types were placed in this encounter.  CHIEF COMPLAINT:  CC: neuroendocrine tumor   Current Treatment: Surveillance  INTERVAL HISTORY:  Lisa Glass is here today for repeat clinical assessment.  She was last seen by me on 09/20/2023.  Recently had restaging CT CAP on 03/21/2024.  These reports showed no evidence of residual, metastatic, or  recurrent disease in the chest, abdomen, or pelvis.  She does have a stable, small left lower lung nodule measuring 0.5 cm in diameter.  This will be monitored closely.  She denies any new symptoms or concerns.  Does have some issues with urinary frequency and urgency.  She relates this mostly to GYN issues.  States she has annual exam coming up in February 2026 but will discuss pelvic floor rehab with her GYN provider.  She denies chest pain, chest pressure, or shortness of breath. She denies headaches or visual disturbances. She denies abdominal pain, nausea, vomiting, or changes in bowel or bladder habits.  She denies fevers or chills. She denies pain. Her appetite is good. Her weight has decreased 7 pounds over last 3 months.  I have reviewed the past medical history, past surgical history, social history and family history with the patient and they are unchanged from previous note.  ALLERGIES:  has no known allergies.  MEDICATIONS:  Current Outpatient Medications  Medication Sig Dispense Refill   buPROPion  (WELLBUTRIN  XL) 150 MG 24 hr tablet Take 1 tablet (150 mg total) by mouth daily. 90 tablet 1   estradiol  (CLIMARA  - DOSED IN MG/24 HR) 0.05 mg/24hr patch Apply 1 patch onto skin every week 12 patch 1   estradiol  (ESTRACE ) 0.1 MG/GM vaginal cream Place 0.5 g vaginally every evening for 1 week then use 2-3 times weekly as directed. (Patient taking differently: Place 0.5 g vaginally daily as needed.) 42.5 g 2   losartan  (COZAAR ) 25 MG tablet Take 1 tablet (25 mg total) by mouth daily along with a 50mg  tablet for a total daily dose of 75mg . 90 tablet 3   losartan  (COZAAR ) 50 MG tablet Take 1 tablet (50 mg total) by mouth daily in addition to 25mg  tablet. 90 tablet 3   medroxyPROGESTERone  (PROVERA ) 10 MG tablet Take 1 tablet by mouth every day for the first 12 days of the month 36 tablet 3   Multiple Vitamin (MULTI-VITAMIN) tablet Take 1 tablet by mouth daily.     nystatin -triamcinolone  ointment  (MYCOLOG) Apply topically to affected area twice daily as directed. 60 g 1   Sodium Fluoride  (PREVIDENT  5000 BOOSTER PLUS) 1.1 % PSTE Apply a thin ribbon toothpaste to brush. Brush thoroughly once daily for 2 minutes, preferably at bedtime. For best results don't eat or drink for 30 minutes. 100 mL 3   Sodium Fluoride  1.1 % PSTE Apply a thin ribbon to toothbrush and brush thoroughly once daily for 2 minutes, preferably at bedtime. 100 mL 6   triamcinolone  cream (KENALOG ) 0.5 % Apply 1 Application topically as needed. To affected areas. 15 g 1   valACYclovir  (VALTREX ) 1000 MG tablet Take 2 tablets (2,000 mg total) by mouth 2 (two) times daily as needed. 10 tablet 2   No current facility-administered medications for this visit.     REVIEW OF SYSTEMS:   Constitutional: Denies fevers, chills or abnormal weight loss Eyes: Denies blurriness of vision Ears, nose, mouth, throat, and face: Denies mucositis or sore throat Respiratory: Denies cough, dyspnea or wheezes Cardiovascular: Denies palpitation, chest discomfort or lower extremity swelling Gastrointestinal:  Denies nausea, heartburn or change in bowel habits Skin: Denies abnormal skin  rashes Lymphatics: Denies new lymphadenopathy or easy bruising Neurological:Denies numbness, tingling or new weaknesses Behavioral/Psych: Mood is stable, no new changes  All other systems were reviewed with the patient and are negative.   VITALS:   Today's Vitals   03/28/24 0836 03/28/24 0841  BP: 130/80   Pulse: 66   Resp: 17   Temp: 97.8 F (36.6 C)   SpO2: 99%   Weight: 188 lb 1.6 oz (85.3 kg)   PainSc:  0-No pain   Body mass index is 31.3 kg/m.   Wt Readings from Last 3 Encounters:  03/28/24 188 lb 1.6 oz (85.3 kg)  12/31/23 193 lb 12.8 oz (87.9 kg)  09/20/23 189 lb 8 oz (86 kg)    Body mass index is 31.3 kg/m.  Performance status (ECOG): 1 - Symptomatic but completely ambulatory  PHYSICAL EXAM:   GENERAL:alert, no distress and  comfortable SKIN: skin color, texture, turgor are normal, no rashes or significant lesions EYES: normal, Conjunctiva are pink and non-injected, sclera clear OROPHARYNX:no exudate, no erythema and lips, buccal mucosa, and tongue normal  NECK: supple, thyroid normal size, non-tender, without nodularity LYMPH:  no palpable lymphadenopathy in the cervical, axillary or inguinal LUNGS: clear to auscultation and percussion with normal breathing effort HEART: regular rate & rhythm and no murmurs and no lower extremity edema ABDOMEN:abdomen soft, non-tender and normal bowel sounds Musculoskeletal:no cyanosis of digits and no clubbing  NEURO: alert & oriented x 3 with fluent speech, no focal motor/sensory deficits  LABORATORY DATA:  I have reviewed the data as listed    Component Value Date/Time   NA 137 03/28/2024 0745   K 4.4 03/28/2024 0745   CL 103 03/28/2024 0745   CO2 25 03/28/2024 0745   GLUCOSE 91 03/28/2024 0745   BUN 19 03/28/2024 0745   CREATININE 1.08 (H) 03/28/2024 0745   CREATININE 0.91 03/21/2020 0000   CALCIUM 9.8 03/28/2024 0745   PROT 7.1 03/28/2024 0745   ALBUMIN 4.6 03/28/2024 0745   AST 25 03/28/2024 0745   ALT 12 03/28/2024 0745   ALKPHOS 66 03/28/2024 0745   BILITOT 0.5 03/28/2024 0745   GFRNONAA 60 (L) 03/28/2024 0745   GFRNONAA 73 03/21/2020 0000   GFRAA 84 03/21/2020 0000     Lab Results  Component Value Date   WBC 6.2 03/28/2024   NEUTROABS 3.7 03/28/2024   HGB 13.7 03/28/2024   HCT 39.3 03/28/2024   MCV 94.2 03/28/2024   PLT 207 03/28/2024    RADIOGRAPHIC STUDIES: ICT CHEST ABDOMEN PELVIS W CONTRAST Result Date: 03/28/2024 EXAM: CT CHEST, ABDOMEN AND PELVIS WITH CONTRAST 03/21/2024 01:19:32 PM TECHNIQUE: CT of the chest, abdomen and pelvis was performed with the administration of 100 mL iohexol  (OMNIPAQUE ) 300 MG/ML solution. Multiplanar reformatted images are provided for review. Automated exposure control, iterative reconstruction, and/or weight  based adjustment of the mA/kV was utilized to reduce the radiation dose to as low as reasonably achievable. COMPARISON: None available. CLINICAL HISTORY: Well differentiated intraabdominal neuroendocrine tumor restaging. * Tracking Code: BO * FINDINGS: CHEST: MEDIASTINUM AND LYMPH NODES: Heart and pericardium are unremarkable. The central airways are clear. No mediastinal, hilar or axillary lymphadenopathy. LUNGS AND PLEURA: Stable scarring versus cavitary nodule anteriorly in the left lower lobe 0.5 cm in diameter on image 79 series 3. No pleural effusion or pneumothorax. ABDOMEN AND PELVIS: LIVER: The liver is unremarkable. GALLBLADDER AND BILE DUCTS: Mildly contracted gallbladder. No biliary ductal dilatation. SPLEEN: No acute abnormality. PANCREAS: No acute abnormality. ADRENAL GLANDS: No acute abnormality.  KIDNEYS, URETERS AND BLADDER: Right nephrectomy. No stones in the kidneys or ureters. No hydronephrosis. No perinephric or periureteral stranding. Urinary bladder is unremarkable. GI AND BOWEL: Right hemicolectomy. Stomach demonstrates no acute abnormality. There is no bowel obstruction. REPRODUCTIVE ORGANS: No acute abnormality. PERITONEUM AND RETROPERITONEUM: No recurrent mesenteric mass observed. No ascites. No free air. VASCULATURE: Aorta is normal in caliber. ABDOMINAL AND PELVIS LYMPH NODES: No lymphadenopathy. REPRODUCTIVE ORGANS: No acute abnormality. BONES AND SOFT TISSUES: Central disc protrusion at L1-L2 noted. Disc bulges in the lower lumbar spine are present. No acute osseous abnormality. No focal soft tissue abnormality. IMPRESSION: 1. No recurrent mesenteric mass and no evidence of metastatic disease. 2. Stable scarring versus cavitary nodule in the anterior left lower lobe measuring 0.5 cm. 3. Status post right nephrectomy and right hemicolectomy. 4. Central disc protrusion at L1-2 and disc bulges in the lower lumbar spine. 5. Additional chronic and incidental findings are present, including  mildly contracted gallbladder. Electronically signed by: Ryan Salvage MD 03/28/2024 10:18 AM EST RP Workstation: [id number]    Addendum I have seen the patient, examined her. I agree with the assessment and and plan and have edited the notes.   Lisa Glass is clinically doing well.  I personally reviewed her surveillance CT scan images, no evidence of recurrence to me, left lung nodule is stable, but as a formal report is still pending.  Will continue cancer surveillance, in 6 months.  Onita Mattock MD 03/28/2024 "

## 2024-03-27 NOTE — Assessment & Plan Note (Signed)
 Stage: II vs IV, cT2cN0pMx, mitotic index low at 1 mitosis/10 hpfs and ki 67 index 1-2%  -mesentery mass was initially found on CT in 2016, due to enlarging mass on CT, she underwent laparoscopic appendectomy with excision of mesenteric mass on 04/16/2020 which showed well differentiated neuroendocrine tumor (carcinoid tumor), foci suspicious for LVI, background appendix without diagnostic abnormality. Comment: tumor located in the mesentery and approaches the soft tissue margin without definitive involvement, mitotic index low at 1 mitosis/10 hpfs and ki 67 index 1-2%. Entire appendix is negative for tumor. -05/08/2020 NETSPOT  PET: Two areas of hypermetabolic activity over the right lower lobe but no abnormality in the CT in either of these areas, scattered nonspecific areas of activity over bowel, single hypermetabolic right inguinal node. -05/28/2020 US  guided biopsy of right inguinal LN: negative -Patient underwent robotic ileocolectomy on 10/02/2020 and pathology showed unremarkable segment of terminal ileum and colon with a surgically absent appendix, no residual neoplasm or malignancy identified, 0/19 benign lymph nodes  -she has been on surveillance since then, no evidence of disease on last CT scan on 03/18/2023.   -surveillance CT CAP from 03/21/2024 showed no evidence of residual disease, recurrence or metastasis in the chest, abdomen, or pelvis.  Small, stable lung nodule in the left lower lobe of the lungs.  This will be monitored closely.  Exam is benign today.  Plan to follow-up in 6 months, sooner if needed.  Repeat CT CAP in 1 year.

## 2024-03-28 ENCOUNTER — Encounter: Payer: Self-pay | Admitting: Nurse Practitioner

## 2024-03-28 ENCOUNTER — Inpatient Hospital Stay: Attending: Nurse Practitioner

## 2024-03-28 ENCOUNTER — Inpatient Hospital Stay (HOSPITAL_BASED_OUTPATIENT_CLINIC_OR_DEPARTMENT_OTHER): Admitting: Nurse Practitioner

## 2024-03-28 VITALS — BP 130/80 | HR 66 | Temp 97.8°F | Resp 17 | Wt 188.1 lb

## 2024-03-28 DIAGNOSIS — R350 Frequency of micturition: Secondary | ICD-10-CM | POA: Diagnosis not present

## 2024-03-28 DIAGNOSIS — R531 Weakness: Secondary | ICD-10-CM | POA: Insufficient documentation

## 2024-03-28 DIAGNOSIS — D3A8 Other benign neuroendocrine tumors: Secondary | ICD-10-CM

## 2024-03-28 DIAGNOSIS — Z79899 Other long term (current) drug therapy: Secondary | ICD-10-CM | POA: Diagnosis not present

## 2024-03-28 DIAGNOSIS — R911 Solitary pulmonary nodule: Secondary | ICD-10-CM | POA: Diagnosis not present

## 2024-03-28 LAB — CMP (CANCER CENTER ONLY)
ALT: 12 U/L (ref 0–44)
AST: 25 U/L (ref 15–41)
Albumin: 4.6 g/dL (ref 3.5–5.0)
Alkaline Phosphatase: 66 U/L (ref 38–126)
Anion gap: 10 (ref 5–15)
BUN: 19 mg/dL (ref 6–20)
CO2: 25 mmol/L (ref 22–32)
Calcium: 9.8 mg/dL (ref 8.9–10.3)
Chloride: 103 mmol/L (ref 98–111)
Creatinine: 1.08 mg/dL — ABNORMAL HIGH (ref 0.44–1.00)
GFR, Estimated: 60 mL/min — ABNORMAL LOW
Glucose, Bld: 91 mg/dL (ref 70–99)
Potassium: 4.4 mmol/L (ref 3.5–5.1)
Sodium: 137 mmol/L (ref 135–145)
Total Bilirubin: 0.5 mg/dL (ref 0.0–1.2)
Total Protein: 7.1 g/dL (ref 6.5–8.1)

## 2024-03-28 LAB — CBC WITH DIFFERENTIAL (CANCER CENTER ONLY)
Abs Immature Granulocytes: 0.02 K/uL (ref 0.00–0.07)
Basophils Absolute: 0.1 K/uL (ref 0.0–0.1)
Basophils Relative: 1 %
Eosinophils Absolute: 0.1 K/uL (ref 0.0–0.5)
Eosinophils Relative: 2 %
HCT: 39.3 % (ref 36.0–46.0)
Hemoglobin: 13.7 g/dL (ref 12.0–15.0)
Immature Granulocytes: 0 %
Lymphocytes Relative: 32 %
Lymphs Abs: 2 K/uL (ref 0.7–4.0)
MCH: 32.9 pg (ref 26.0–34.0)
MCHC: 34.9 g/dL (ref 30.0–36.0)
MCV: 94.2 fL (ref 80.0–100.0)
Monocytes Absolute: 0.4 K/uL (ref 0.1–1.0)
Monocytes Relative: 6 %
Neutro Abs: 3.7 K/uL (ref 1.7–7.7)
Neutrophils Relative %: 59 %
Platelet Count: 207 K/uL (ref 150–400)
RBC: 4.17 MIL/uL (ref 3.87–5.11)
RDW: 12.7 % (ref 11.5–15.5)
WBC Count: 6.2 K/uL (ref 4.0–10.5)
nRBC: 0 % (ref 0.0–0.2)

## 2024-03-29 LAB — CHROMOGRANIN A: Chromogranin A (ng/mL): 125.4 ng/mL — ABNORMAL HIGH (ref 0.0–101.8)

## 2024-03-30 ENCOUNTER — Other Ambulatory Visit: Payer: Self-pay | Admitting: Medical Genetics

## 2024-03-31 ENCOUNTER — Other Ambulatory Visit: Payer: Self-pay | Admitting: Nurse Practitioner

## 2024-03-31 DIAGNOSIS — D3A8 Other benign neuroendocrine tumors: Secondary | ICD-10-CM

## 2024-04-03 ENCOUNTER — Telehealth: Payer: Self-pay | Admitting: Nurse Practitioner

## 2024-04-03 NOTE — Telephone Encounter (Signed)
 Pt is aware of lab

## 2024-04-05 ENCOUNTER — Other Ambulatory Visit: Payer: Self-pay

## 2024-04-05 DIAGNOSIS — D3A8 Other benign neuroendocrine tumors: Secondary | ICD-10-CM

## 2024-04-05 DIAGNOSIS — C7A00 Malignant carcinoid tumor of unspecified site: Secondary | ICD-10-CM

## 2024-04-07 ENCOUNTER — Other Ambulatory Visit: Payer: Self-pay

## 2024-04-12 LAB — 5 HIAA, QUANTITATIVE, URINE, 24 HOUR
5-HIAA, Ur: 1 mg/L
5-HIAA,Quant.,24 Hr Urine: 3.1 mg/(24.h) (ref 0.0–14.9)
Total Volume: 3000

## 2024-04-13 ENCOUNTER — Ambulatory Visit: Payer: Self-pay | Admitting: Nurse Practitioner

## 2024-04-13 ENCOUNTER — Other Ambulatory Visit: Payer: Self-pay

## 2024-04-13 ENCOUNTER — Other Ambulatory Visit (HOSPITAL_COMMUNITY): Payer: Self-pay

## 2024-04-14 ENCOUNTER — Other Ambulatory Visit: Payer: Self-pay

## 2024-04-14 ENCOUNTER — Other Ambulatory Visit (HOSPITAL_COMMUNITY): Payer: Self-pay

## 2024-04-14 MED ORDER — ESTRADIOL 0.05 MG/24HR TD PTWK
MEDICATED_PATCH | TRANSDERMAL | 0 refills | Status: AC
  Filled 2024-04-14: qty 4, 28d supply, fill #0
  Filled 2024-04-29: qty 12, 84d supply, fill #0

## 2024-04-16 ENCOUNTER — Other Ambulatory Visit: Payer: Self-pay

## 2024-04-18 ENCOUNTER — Other Ambulatory Visit (HOSPITAL_COMMUNITY): Payer: Self-pay

## 2024-04-29 ENCOUNTER — Other Ambulatory Visit (HOSPITAL_COMMUNITY): Payer: Self-pay

## 2024-05-15 ENCOUNTER — Inpatient Hospital Stay: Attending: Nurse Practitioner

## 2024-05-22 ENCOUNTER — Other Ambulatory Visit (HOSPITAL_COMMUNITY)

## 2024-06-30 ENCOUNTER — Ambulatory Visit: Admitting: Medical-Surgical

## 2024-09-27 ENCOUNTER — Inpatient Hospital Stay

## 2024-09-27 ENCOUNTER — Inpatient Hospital Stay: Admitting: Nurse Practitioner
